# Patient Record
Sex: Female | Born: 1975 | Hispanic: No | Smoking: Never smoker
Health system: Southern US, Community
[De-identification: ages and names within clinical notes are randomized; demographics above are authoritative.]

## PROBLEM LIST (undated history)

## (undated) DIAGNOSIS — R51 Headache: Secondary | ICD-10-CM

## (undated) DIAGNOSIS — G10 Huntington's disease: Secondary | ICD-10-CM

## (undated) DIAGNOSIS — M069 Rheumatoid arthritis, unspecified: Secondary | ICD-10-CM

## (undated) DIAGNOSIS — R519 Headache, unspecified: Secondary | ICD-10-CM

## (undated) DIAGNOSIS — H269 Unspecified cataract: Secondary | ICD-10-CM

## (undated) HISTORY — DX: Headache: R51

## (undated) HISTORY — DX: Rheumatoid arthritis, unspecified: M06.9

## (undated) HISTORY — DX: Headache, unspecified: R51.9

## (undated) HISTORY — PX: KNEE SURGERY: SHX244

## (undated) HISTORY — DX: Unspecified cataract: H26.9

---

## 2008-01-16 ENCOUNTER — Other Ambulatory Visit: Admission: RE | Admit: 2008-01-16 | Discharge: 2008-01-16 | Payer: Self-pay | Admitting: Family Medicine

## 2011-08-19 ENCOUNTER — Other Ambulatory Visit: Payer: Self-pay | Admitting: Nurse Practitioner

## 2011-08-19 DIAGNOSIS — R1900 Intra-abdominal and pelvic swelling, mass and lump, unspecified site: Secondary | ICD-10-CM

## 2011-08-19 DIAGNOSIS — R102 Pelvic and perineal pain: Secondary | ICD-10-CM

## 2011-08-23 ENCOUNTER — Other Ambulatory Visit: Payer: Self-pay

## 2011-08-29 ENCOUNTER — Ambulatory Visit
Admission: RE | Admit: 2011-08-29 | Discharge: 2011-08-29 | Disposition: A | Discharge status: Home / Self Care | Payer: Medicaid Other | Source: Ambulatory Visit | Attending: Nurse Practitioner | Admitting: Nurse Practitioner

## 2011-08-29 DIAGNOSIS — R102 Pelvic and perineal pain: Secondary | ICD-10-CM

## 2011-08-29 DIAGNOSIS — R1900 Intra-abdominal and pelvic swelling, mass and lump, unspecified site: Secondary | ICD-10-CM

## 2013-06-01 ENCOUNTER — Encounter (HOSPITAL_COMMUNITY): Payer: Self-pay | Admitting: Emergency Medicine

## 2013-06-01 ENCOUNTER — Emergency Department (HOSPITAL_COMMUNITY)
Admission: EM | Admit: 2013-06-01 | Discharge: 2013-06-01 | Disposition: A | Discharge status: Home / Self Care | Payer: Medicaid Other | Attending: Emergency Medicine | Admitting: Emergency Medicine

## 2013-06-01 DIAGNOSIS — M129 Arthropathy, unspecified: Secondary | ICD-10-CM | POA: Insufficient documentation

## 2013-06-01 DIAGNOSIS — L02219 Cutaneous abscess of trunk, unspecified: Secondary | ICD-10-CM | POA: Insufficient documentation

## 2013-06-01 DIAGNOSIS — L03319 Cellulitis of trunk, unspecified: Principal | ICD-10-CM

## 2013-06-01 DIAGNOSIS — R000 Tachycardia, unspecified: Secondary | ICD-10-CM | POA: Insufficient documentation

## 2013-06-01 DIAGNOSIS — G10 Huntington's disease: Secondary | ICD-10-CM | POA: Insufficient documentation

## 2013-06-01 DIAGNOSIS — L0291 Cutaneous abscess, unspecified: Secondary | ICD-10-CM

## 2013-06-01 HISTORY — DX: Huntington's disease: G10

## 2013-06-01 LAB — BASIC METABOLIC PANEL
BUN: 12 mg/dL (ref 6–23)
CHLORIDE: 103 meq/L (ref 96–112)
CO2: 22 mEq/L (ref 19–32)
Calcium: 9 mg/dL (ref 8.4–10.5)
Creatinine, Ser: 0.78 mg/dL (ref 0.50–1.10)
GFR calc Af Amer: >90 mL/min (ref >90)
GLUCOSE: 120 mg/dL — AB (ref 70–99)
POTASSIUM: 3.7 meq/L (ref 3.7–5.3)
Sodium: 138 mEq/L (ref 137–147)

## 2013-06-01 LAB — URINALYSIS, ROUTINE W REFLEX MICROSCOPIC
Bilirubin Urine: NEGATIVE
Glucose, UA: 250 mg/dL — AB
Hgb urine dipstick: NEGATIVE
Ketones, ur: NEGATIVE mg/dL
NITRITE: NEGATIVE
PH: 5.5 (ref 5.0–8.0)
Protein, ur: NEGATIVE mg/dL
SPECIFIC GRAVITY, URINE: 1.013 (ref 1.005–1.030)
Urobilinogen, UA: 0.2 mg/dL (ref 0.0–1.0)

## 2013-06-01 LAB — CBC WITH DIFFERENTIAL/PLATELET
Basophils Absolute: 0 10*3/uL (ref 0.0–0.1)
Basophils Relative: 0 % (ref 0–1)
EOS ABS: 0.1 10*3/uL (ref 0.0–0.7)
Eosinophils Relative: 1 % (ref 0–5)
HCT: 40.2 % (ref 36.0–46.0)
HEMOGLOBIN: 13.5 g/dL (ref 12.0–15.0)
LYMPHS ABS: 1.4 10*3/uL (ref 0.7–4.0)
Lymphocytes Relative: 14 % (ref 12–46)
MCH: 29.4 pg (ref 26.0–34.0)
MCHC: 33.6 g/dL (ref 30.0–36.0)
MCV: 87.6 fL (ref 78.0–100.0)
Monocytes Absolute: 0.6 10*3/uL (ref 0.1–1.0)
Monocytes Relative: 7 % (ref 3–12)
NEUTROS ABS: 7.6 10*3/uL (ref 1.7–7.7)
NEUTROS PCT: 78 % — AB (ref 43–77)
PLATELETS: 221 10*3/uL (ref 150–400)
RBC: 4.59 MIL/uL (ref 3.87–5.11)
RDW: 12.4 % (ref 11.5–15.5)
WBC: 9.7 10*3/uL (ref 4.0–10.5)

## 2013-06-01 LAB — POC URINE PREG, ED: PREG TEST UR: NEGATIVE

## 2013-06-01 LAB — URINE MICROSCOPIC-ADD ON

## 2013-06-01 MED ORDER — SODIUM CHLORIDE 0.9 % IV BOLUS (SEPSIS)
1000.0000 mL | Freq: Once | INTRAVENOUS | Status: AC
  Administered 2013-06-01: 1000 mL via INTRAVENOUS

## 2013-06-01 MED ORDER — ACETAMINOPHEN 500 MG PO TABS
1000.0000 mg | ORAL_TABLET | Freq: Once | ORAL | Status: AC
  Administered 2013-06-01: 1000 mg via ORAL
  Filled 2013-06-01: qty 2

## 2013-06-01 MED ORDER — SODIUM CHLORIDE 0.9 % IV SOLN
INTRAVENOUS | Status: DC
  Administered 2013-06-01: 18:00:00 via INTRAVENOUS

## 2013-06-01 NOTE — Discharge Instructions (Signed)
Return to ED in 2 days for reevaluation of Abscess.    Abscess An abscess (boil or furuncle) is an infected area on or under the skin. This area is filled with yellowish-white fluid (pus) and other material (debris). HOME CARE   Only take medicines as told by your doctor.  If you were given antibiotic medicine, take it as directed. Finish the medicine even if you start to feel better.  If gauze is used, follow your doctor's directions for changing the gauze.  To avoid spreading the infection:  Keep your abscess covered with a bandage.  Wash your hands well.  Do not share personal care items, towels, or whirlpools with others.  Avoid skin contact with others.  Keep your skin and clothes clean around the abscess.  Keep all doctor visits as told. GET HELP RIGHT AWAY IF:   You have more pain, puffiness (swelling), or redness in the wound site.  You have more fluid or blood coming from the wound site.  You have muscle aches, chills, or you feel sick.  You have a fever. MAKE SURE YOU:   Understand these instructions.  Will watch your condition.  Will get help right away if you are not doing well or get worse. Document Released: 08/31/2007 Document Revised: 09/13/2011 Document Reviewed: 05/27/2011 [facility] Patient Information 2014 [place]ExitCare, [place]LLC.

## 2013-06-01 NOTE — ED Provider Notes (Signed)
CSN: [id number]     Arrival date & time 06/01/13  1433 History   First MD Initiated Contact with Patient 06/01/13 1446     Chief Complaint  Patient presents with  . Abscess     (Consider location/radiation/quality/duration/timing/severity/associated sxs/prior Treatment) HPI 38 yo female presents with abscess to low back that started 3 days ago. Patient states pain has gotten progressively worse. Pain described as throbbing pain that is rated at 9/10 and constant. Patient has not tried anything for her pain. Patient denies any fever or chills, N/V, HA, CP, SOB, Abdominal pain. PMH significant for Huntingtons disease and arthritis. Denies any current medication use. Denies alcohol or tobacco use.  d Past Medical History  Diagnosis Date  . Huntington disease   . Arthritis    History reviewed. No pertinent past surgical history. No family history on file. History  Substance Use Topics  . Smoking status: Never Smoker   . Smokeless tobacco: Not on file  . Alcohol Use: No   OB History   Grav Para Term Preterm Abortions TAB SAB Ect Mult Living                 Review of Systems  All other systems reviewed and are negative.      Allergies  Bee venom  Home Medications  No current outpatient prescriptions on file. BP 111/71  Pulse 135  Temp(Src) 97.9 F (36.6 C) (Oral)  Resp 18  SpO2 100% Physical Exam  Nursing note and vitals reviewed. Constitutional: She is oriented to person, place, and time. She appears well-developed and well-nourished. No distress.  HENT:  Head: Normocephalic and atraumatic.  Right Ear: Tympanic membrane and ear canal normal.  Left Ear: Tympanic membrane and ear canal normal.  Nose: Nose normal. Right sinus exhibits no maxillary sinus tenderness and no frontal sinus tenderness. Left sinus exhibits no maxillary sinus tenderness and no frontal sinus tenderness.  Mouth/Throat: Uvula is midline, oropharynx is clear and moist and mucous membranes are  normal. No oropharyngeal exudate, posterior oropharyngeal edema or posterior oropharyngeal erythema.  Eyes: Conjunctivae are normal. Right eye exhibits no discharge. Left eye exhibits no discharge. No scleral icterus.  Neck: Phonation normal. Neck supple. No JVD present. No spinous process tenderness and no muscular tenderness present. No rigidity. No tracheal deviation, no edema and no erythema present.  Cardiovascular: Regular rhythm.  Tachycardia present.  Exam reveals no gallop and no friction rub.   No murmur heard. Pulmonary/Chest: Effort normal and breath sounds normal. No stridor. No respiratory distress. She has no wheezes. She has no rhonchi. She has no rales.  Abdominal: Soft. Bowel sounds are normal. She exhibits no distension. There is no hepatosplenomegaly. There is no tenderness. There is no rigidity, no rebound, no guarding, no tenderness at McBurney's point and negative Murphy's sign.  Musculoskeletal: Normal range of motion. She exhibits no edema.  Lymphadenopathy:    She has no cervical adenopathy.  Neurological: She is alert and oriented to person, place, and time.  Skin: Skin is warm and dry. She is not diaphoretic.     Psychiatric: She has a normal mood and affect. Her behavior is normal.    ED Course  Procedures (including critical care time) Labs Review Labs Reviewed  URINALYSIS, ROUTINE W REFLEX MICROSCOPIC - Abnormal; Notable for the following:    APPearance CLOUDY (*)    Glucose, UA 250 (*)    Leukocytes, UA MODERATE (*)    All other components within normal limits  CBC WITH  DIFFERENTIAL - Abnormal; Notable for the following:    Neutrophils Relative % 78 (*)    All other components within normal limits  BASIC METABOLIC PANEL - Abnormal; Notable for the following:    Glucose, Bld 120 (*)    All other components within normal limits  URINE MICROSCOPIC-ADD ON - Abnormal; Notable for the following:    Squamous Epithelial / LPF FEW (*)    Bacteria, UA FEW (*)      All other components within normal limits  POC URINE PREG, ED   Imaging Review No results found.   EKG Interpretation None     INCISION AND DRAINAGE Performed by: Rudene Anda Consent: Verbal consent obtained. Risks and benefits: risks, benefits and alternatives were discussed Time out performed prior to procedure Type: abscess Body area: low back Anesthesia: local infiltration Incision was made with a scalpel. Local anesthetic: lidocaine 2% w/ epinephrine Anesthetic total: 4 ml Complexity: complex Blunt dissection to break up loculations Drainage: purulent Drainage amount: 3 ml Packing material: N/A Patient tolerance: Patient tolerated the procedure well with no immediate complications.  MDM   Final diagnoses:  Abscess    Patient tachycardic on presentation that resolved with IV fluids.   Urine preg negative UA shows mild glucosuria with moderate leukocytes and few bacteria. Sample appears contaminated.  CBC is WNL BMP is WNL  Discussed labs, and exam findings with patient. Advised follow in ED in 2 days for recheck of abscess.  Recommend return to ED should patient develop any fever/chills or worsening symptoms.  Patient agrees with plan. Discharged in good condition.  Meds given in ED:  Medications  acetaminophen (TYLENOL) tablet 1,000 mg (1,000 mg Oral Given 06/01/13 1557)  sodium chloride 0.9 % bolus 1,000 mL (0 mLs Intravenous Stopped 06/01/13 1757)    There are no discharge medications for this patient.          Allen Norris [place], PA-C 06/02/13 [phone]

## 2013-06-01 NOTE — ED Notes (Signed)
Pt presents to department for evaluation of lower back abscess. Noticed x2 days ago. 4/10 pain upon arrival. Red, raised area upon arrival to ED. Pt is alert and oriented x4.

## 2013-06-02 NOTE — ED Provider Notes (Signed)
Medical screening examination/treatment/procedure(s) were conducted as a shared visit with non-physician practitioner(s) and myself.  I personally evaluated the patient during the encounter.   EKG Interpretation None     Here with abscess noted to her lower lumbar spine. Was noted to be mildly tachycardic. Given IV fluids here and blood work evaluated without acute findings. Tachycardia has resolved and she is stable for discharge. No evidence of systemic infection at this time.  Toy BakerAnthony T Oluwateniola Leitch, MD 06/02/13 [phone]

## 2014-01-04 ENCOUNTER — Encounter (HOSPITAL_BASED_OUTPATIENT_CLINIC_OR_DEPARTMENT_OTHER): Payer: Self-pay | Admitting: Emergency Medicine

## 2014-01-04 ENCOUNTER — Emergency Department (HOSPITAL_BASED_OUTPATIENT_CLINIC_OR_DEPARTMENT_OTHER)
Admission: EM | Admit: 2014-01-04 | Discharge: 2014-01-04 | Disposition: A | Discharge status: Home / Self Care | Payer: Medicaid Other | Attending: Emergency Medicine | Admitting: Emergency Medicine

## 2014-01-04 DIAGNOSIS — Z8739 Personal history of other diseases of the musculoskeletal system and connective tissue: Secondary | ICD-10-CM | POA: Insufficient documentation

## 2014-01-04 DIAGNOSIS — Z8669 Personal history of other diseases of the nervous system and sense organs: Secondary | ICD-10-CM | POA: Insufficient documentation

## 2014-01-04 DIAGNOSIS — B850 Pediculosis due to Pediculus humanus capitis: Secondary | ICD-10-CM | POA: Insufficient documentation

## 2014-01-04 DIAGNOSIS — B86 Scabies: Secondary | ICD-10-CM | POA: Insufficient documentation

## 2014-01-04 MED ORDER — PERMETHRIN 5 % EX CREA: TOPICAL_CREAM | CUTANEOUS | Status: DC

## 2014-01-04 MED ORDER — DIPHENHYDRAMINE HCL 25 MG PO TABS: 25.0000 mg | ORAL_TABLET | Freq: Four times a day (QID) | ORAL | Status: DC

## 2014-01-04 MED ORDER — DIPHENHYDRAMINE HCL 25 MG PO CAPS
25.0000 mg | ORAL_CAPSULE | Freq: Once | ORAL | Status: AC
  Administered 2014-01-04: 25 mg via ORAL
  Filled 2014-01-04: qty 1

## 2014-01-04 NOTE — ED Provider Notes (Signed)
CSN: [id number]     Arrival date & time 01/04/14  1025 History  This chart was scribed for Robyn OctaveStephen Noor Vidales, MD by Robyn Valenzuela, ED Scribe. This patient was seen in room MH01/MH01 and the patient's care was started 10:58 AM.    Chief Complaint  Patient presents with  . Rash    The history is provided by the patient. No language interpreter was used.    HPI Comments: Robyn Valenzuela is a 38 y.o. female who presents to the Emergency Department complaining of constant, unchanged itching to the entire body that began 1 month ago and scalp itching that began 1 week ago. She reports living with someone who had similar symptoms. She states she has not seen any insects crawling on or near her. She has not taken any medications to relieve her symptoms. She states she has been eating and drinking well. She denies vomiting. She reports a history of Huntington's disease but does not take medications for this.   Past Medical History  Diagnosis Date  . Huntington disease   . Arthritis    History reviewed. No pertinent past surgical history. No family history on file. History  Substance Use Topics  . Smoking status: Never Smoker   . Smokeless tobacco: Not on file  . Alcohol Use: No   OB History   Grav Para Term Preterm Abortions TAB SAB Ect Mult Living                 Review of Systems  A complete 10 system review of systems was obtained and all systems are negative except as noted in the HPI and PMH.    Allergies  Bee venom  Home Medications   Prior to Admission medications   Medication Sig Start Date End Date Taking? Authorizing Provider  diphenhydrAMINE (BENADRYL) 25 MG tablet Take 1 tablet (25 mg total) by mouth every 6 (six) hours. 01/04/14   Robyn OctaveStephen Ura Hausen, MD  permethrin (ELIMITE) 5 % cream Apply to affected area once 01/04/14   Robyn OctaveStephen Leighana Neyman, MD   BP 134/80  Pulse 90  Temp(Src) 97.8 F (36.6 C) (Oral)  Resp 18  SpO2 100%  LMP 12/16/2013 Physical Exam  Nursing note and  vitals reviewed. Constitutional: She is oriented to person, place, and time. She appears well-developed and well-nourished. No distress.  HENT:  Head: Normocephalic and atraumatic.  Mouth/Throat: Oropharynx is clear and moist. No oropharyngeal exudate.  Eyes: Conjunctivae and EOM are normal. Pupils are equal, round, and reactive to light.  Neck: Normal range of motion. Neck supple.  No meningismus.  Cardiovascular: Normal rate, regular rhythm, normal heart sounds and intact distal pulses.   No murmur heard. Pulmonary/Chest: Effort normal and breath sounds normal. No respiratory distress.  Abdominal: Soft. There is no tenderness. There is no rebound and no guarding.  Musculoskeletal: Normal range of motion. She exhibits no edema and no tenderness.  Neurological: She is alert and oriented to person, place, and time. No cranial nerve deficit. She exhibits normal muscle tone. Coordination normal.  No ataxia on finger to nose bilaterally. No pronator drift. 5/5 strength throughout. CN 2-12 intact. Negative Romberg. Equal grip strength. Sensation intact. Gait is normal.   Skin: Skin is warm. Rash noted.  Areas of excoriation and erythema.  Areas of dandruff and possible nits to scalp  Psychiatric: She has a normal mood and affect. Her behavior is normal.    ED Course  Procedures (including critical care time)  DIAGNOSTIC STUDIES: Oxygen Saturation is 100% on  RA, normal by my interpretation.    COORDINATION OF CARE: 11:02 AM Discussed treatment plan with pt at bedside and pt agreed to plan.   Labs Review Labs Reviewed - No data to display  Imaging Review No results found.   EKG Interpretation None      MDM   Final diagnoses:  Pediculosis capitis  Scabies   Total body and scalp itching x 1 month.  No fevers.  No one with similar symptoms.  Treat for head lice and possible scabies. Instructed to wash clothes and bed linens in hot water and have close contacts treated as  well.  I personally performed the services described in this documentation, which was scribed in my presence. The recorded information has been reviewed and is accurate.  Robyn OctaveStephen Dekari Bures, MD 01/04/14 [phone]

## 2014-01-04 NOTE — ED Notes (Signed)
Pt c/o itchy rash all over body x 1 mo and head itching for 1 week

## 2014-01-04 NOTE — Discharge Instructions (Signed)
Head and Pubic Lice Lice are tiny, light brown insects with claws on the ends of their legs. They are small parasites that live on the human body. Lice often make their home in your hair. They hatch from little round eggs (nits), which are attached to the base of hairs. They spread by:  Direct contact with an infested person.  Infested personal items such as combs, brushes, towels, clothing, pillow cases and sheets. The parasite that causes your condition may also live in clothes which have been worn within the week before treatment. Therefore, it is necessary to wash your clothes, bed linens, towels, combs and brushes. Any woolens can be put in an air-tight plastic bag for one week. You need to use fresh clothes, towels and sheets after your treatment is completed. Re-treatment is usually not necessary if instructions are followed. If necessary, treatment may be repeated in 7 days. The entire family may require treatment. Sexual partners should be treated if the nits are present in the pubic area. TREATMENT  Apply enough medicated shampoo or cream to wet hair and skin in and around the infected areas.  Work thoroughly into hair and leave in according to instructions.  Add a small amount of water until a good lather forms.  Rinse thoroughly.  Towel briskly.  When hair is dry, any remaining nits, cream or shampoo may be removed with a fine-tooth comb or tweezers. The nits resemble dandruff; however they are glued to the hair follicle and are difficult to brush out. Frequent fine combing and shampoos are necessary. A towel soaked in white vinegar and left on the hair for 2 hours will also help soften the glue which holds the nits on the hair. Medicated shampoo or cream should not be used on children or pregnant women without a caregiver's prescription or instructions. SEEK MEDICAL CARE IF:   You or your child develops sores that look infected.  The rash does not go away in one week.  The  lice or nits return or persist in spite of treatment. Document Released: 03/14/2005 Document Revised: 06/06/2011 Document Reviewed: 10/11/2006 [facility] Patient Information 2015 [place]ExitCare, [place]LL[place]. This information is not intended to replace advice given to you by your health care provider. Make sure you discuss any questions you have with your health care provider.   Scabies Scabies are small bugs (mites) that burrow under the skin and cause red bumps and severe itching. These bugs can only be seen with a microscope. Scabies are highly contagious. They can spread easily from person to person by direct contact. They are also spread through sharing clothing or linens that have the scabies mites living in them. It is not unusual for an entire family to become infected through shared towels, clothing, or bedding.  HOME CARE INSTRUCTIONS   Your caregiver may prescribe a cream or lotion to kill the mites. If cream is prescribed, massage the cream into the entire body from the neck to the bottom of both feet. Also massage the cream into the scalp and face if your child is less than 39 year old. Avoid the eyes and mouth. Do not wash your hands after application.  Leave the cream on for 8 to 12 hours. Your child should bathe or shower after the 8 to 12 hour application period. Sometimes it is helpful to apply the cream to your child right before bedtime.  One treatment is usually effective and will eliminate approximately 95% of infestations. For severe cases, your caregiver may decide to  repeat the treatment in 1 week. Everyone in your household should be treated with one application of the cream.  New rashes or burrows should not appear within 24 to 48 hours after successful treatment. However, the itching and rash may last for 2 to 4 weeks after successful treatment. Your caregiver may prescribe a medicine to help with the itching or to help the rash go away more quickly.  Scabies can live on clothing or  linens for up to 3 days. All of your child's recently used clothing, towels, stuffed toys, and bed linens should be washed in hot water and then dried in a dryer for at least 20 minutes on high heat. Items that cannot be washed should be enclosed in a plastic bag for at least 3 days.  To help relieve itching, bathe your child in a cool bath or apply cool washcloths to the affected areas.  Your child may return to school after treatment with the prescribed cream. SEEK MEDICAL CARE IF:   The itching persists longer than 4 weeks after treatment.  The rash spreads or becomes infected. Signs of infection include red blisters or yellow-tan crust. Document Released: 03/14/2005 Document Revised: 06/06/2011 Document Reviewed: 07/23/2008 [facility] Patient Information 2015 [place]ExitCare, [place]. This information is not intended to replace advice given to you by your health care provider. Make sure you discuss any questions you have with your health care provider.

## 2016-08-06 ENCOUNTER — Encounter (HOSPITAL_COMMUNITY): Payer: Self-pay | Admitting: Emergency Medicine

## 2016-08-06 ENCOUNTER — Emergency Department (HOSPITAL_COMMUNITY)
Admission: EM | Admit: 2016-08-06 | Discharge: 2016-08-07 | Disposition: A | Discharge status: Home / Self Care | Payer: Medicaid Other | Attending: Emergency Medicine | Admitting: Emergency Medicine

## 2016-08-06 DIAGNOSIS — T7840XA Allergy, unspecified, initial encounter: Secondary | ICD-10-CM

## 2016-08-06 MED ORDER — DIPHENHYDRAMINE HCL 50 MG/ML IJ SOLN
25.0000 mg | Freq: Once | INTRAMUSCULAR | Status: AC
  Administered 2016-08-06: 25 mg via INTRAVENOUS
  Filled 2016-08-06: qty 1

## 2016-08-06 MED ORDER — FAMOTIDINE IN NACL 20-0.9 MG/50ML-% IV SOLN
20.0000 mg | Freq: Once | INTRAVENOUS | Status: AC
  Administered 2016-08-06: 20 mg via INTRAVENOUS
  Filled 2016-08-06: qty 50

## 2016-08-06 MED ORDER — IBUPROFEN 400 MG PO TABS
400.0000 mg | ORAL_TABLET | Freq: Once | ORAL | Status: AC
  Administered 2016-08-06: 400 mg via ORAL
  Filled 2016-08-06: qty 1

## 2016-08-06 MED ORDER — METHYLPREDNISOLONE SODIUM SUCC 125 MG IJ SOLR
125.0000 mg | Freq: Once | INTRAMUSCULAR | Status: AC
  Administered 2016-08-06: 125 mg via INTRAVENOUS
  Filled 2016-08-06: qty 2

## 2016-08-06 NOTE — ED Notes (Signed)
Pt given ice pack to put on thumb and hand elevated on blankets

## 2016-08-06 NOTE — ED Triage Notes (Signed)
Pt presents to the ED with swelling and redness to the left thumb after being stung by a yellow jacket.  Hx of allergic reaction and need for epi pen.  Pt axo at this time, denies airway involvement.  Some reddening of the face noted.

## 2016-08-06 NOTE — ED Notes (Signed)
ED Provider at bedside. 

## 2016-08-06 NOTE — ED Provider Notes (Signed)
[facility]-EMERGENCY DEPT Provider Note   CSN: [id number] Arrival date & time: 08/06/16  2112     History   Chief Complaint Chief Complaint  Patient presents with  . Insect Bite  . Allergic Reaction    HPI Robyn Flemingndrea Cogle is a 41 y.o. female.  The history is provided by the patient.  Allergic Reaction  Presenting symptoms: itching, rash and swelling   Presenting symptoms: no difficulty breathing and no difficulty swallowing   Severity:  Severe Duration:  1 hour Prior allergic episodes:  Insect allergies Context: insect bite/sting   Relieved by:  None tried Worsened by:  Nothing Ineffective treatments:  None tried   Past Medical History:  Diagnosis Date  . Arthritis   . Huntington disease (HCC)     There are no active problems to display for this patient.   History reviewed. No pertinent surgical history.  OB History    No data available       Home Medications    Prior to Admission medications   Medication Sig Start Date End Date Taking? Authorizing Provider  diphenhydrAMINE (BENADRYL) 25 MG tablet Take 1 tablet (25 mg total) by mouth every 6 (six) hours. 01/04/14   Rancour, Jeannett SeniorStephen, MD  permethrin [place](ELIMITE) 5 % cream Apply to affected area once 01/04/14   Glynn Octaveancour, Stephen, MD    Family History History reviewed. No pertinent family history.  Social History Social History  Substance Use Topics  . Smoking status: Never Smoker  . Smokeless tobacco: Never Used  . Alcohol use No     Allergies   Bee venom   Review of Systems Review of Systems  HENT: Negative for trouble swallowing.   Skin: Positive for itching and rash.  All other systems reviewed and are negative.    Physical Exam Updated Vital Signs BP (!) 128/97 (BP Location: Right Arm)   Pulse 75   Temp 98 F (36.7 C) (Oral)   Resp 16   SpO2 100%   Physical Exam  Constitutional: She is oriented to person, place, and time. She appears well-developed and well-nourished.  HENT:  Head:  Normocephalic and atraumatic.  Mouth/Throat: Oropharynx is clear and moist.  No tongue or uvula edema  Eyes: Conjunctivae and EOM are normal. Pupils are equal, round, and reactive to light.  Neck: Normal range of motion. Neck supple.  Cardiovascular: Normal rate, regular rhythm and intact distal pulses.   No murmur heard. Pulmonary/Chest: Effort normal and breath sounds normal. No respiratory distress. She has no wheezes. She has no rales.  Abdominal: Soft. She exhibits no distension. There is no tenderness. There is no rebound and no guarding.  Musculoskeletal: Normal range of motion. She exhibits no edema or tenderness.  Neurological: She is alert and oriented to person, place, and time.  Skin: Skin is warm and dry. Rash noted. Rash is urticarial. No erythema.  Urticaria diffusely over the entire body. Significant swelling in the edema to the left thumb and thenar eminence  Psychiatric: She has a normal mood and affect. Her behavior is normal.  Nursing note and vitals reviewed.    ED Treatments / Results  Labs (all labs ordered are listed, but only abnormal results are displayed) Labs Reviewed - No data to display  EKG  EKG Interpretation None       Radiology No results found.  Procedures Procedures (including critical care time)  Medications Ordered in ED Medications  diphenhydrAMINE (BENADRYL) injection 25 mg (25 mg Intravenous Given 08/06/16 2203)  methylPREDNISolone sodium succinate (  SOLU-MEDROL) 125 mg/2 mL injection 125 mg (125 mg Intravenous Given 08/06/16 2202)  famotidine (PEPCID) IVPB 20 mg premix (0 mg Intravenous Stopped 08/06/16 2238)  ibuprofen (ADVIL,MOTRIN) tablet 400 mg (400 mg Oral Given 08/06/16 2336)     Initial Impression / Assessment and Plan / ED Course  I have reviewed the triage vital signs and the nursing notes.  Pertinent labs & imaging results that were available during my care of the patient were reviewed by me and considered in my medical  decision making (see chart for details).     Patient is a 41 year old female presenting today after being stung by a yellow jacket. Patient is having a significant allergic reaction at this time but no signs of anaphylaxis or breathing difficulty. She has diffuse urticaria but blood pressure and heart rate within normal limits. Patient was given Benadryl, Solu-Medrol and Pepcid. We'll continue to monitor the patient. She has never had an EpiPen before but will need one for the future. 12:21 AM Rash significantly improved and pt feeling better Final Clinical Impressions(s) / ED Diagnoses   Final diagnoses:  Allergic reaction, initial encounter    New Prescriptions New Prescriptions   EPINEPHRINE (EPIPEN 2-PAK) 0.3 MG/0.3 ML IJ SOAJ INJECTION    Inject 0.3 mLs (0.3 mg total) into the muscle once.   PREDNISONE (DELTASONE) 20 MG TABLET    Take 2 tablets (40 mg total) by mouth daily.     Gwyneth Sprout, MD 08/07/16 [phone]

## 2016-08-06 NOTE — ED Notes (Signed)
Decreased redness noted on face, chest and abd. Rash still present but has decreased some since receiving medication.

## 2016-08-07 MED ORDER — PREDNISONE 20 MG PO TABS
40.0000 mg | ORAL_TABLET | Freq: Every day | ORAL | 0 refills | Status: DC
Start: 2016-08-07 — End: 2017-09-05

## 2016-08-07 MED ORDER — EPINEPHRINE 0.3 MG/0.3ML IJ SOAJ: 0.3000 mg | Freq: Once | INTRAMUSCULAR | 1 refills | Status: AC

## 2017-09-05 ENCOUNTER — Encounter: Payer: Self-pay | Admitting: *Deleted

## 2017-09-05 ENCOUNTER — Encounter: Payer: Self-pay | Admitting: Neurology

## 2017-09-05 ENCOUNTER — Ambulatory Visit (INDEPENDENT_AMBULATORY_CARE_PROVIDER_SITE_OTHER): Payer: Medicare Other | Admitting: Neurology

## 2017-09-05 VITALS — BP 133/86 | HR 94 | Ht 62.0 in | Wt 116.0 lb

## 2017-09-05 DIAGNOSIS — R4189 Other symptoms and signs involving cognitive functions and awareness: Secondary | ICD-10-CM

## 2017-09-05 DIAGNOSIS — G10 Huntington's disease: Secondary | ICD-10-CM | POA: Diagnosis not present

## 2017-09-05 MED ORDER — HALOPERIDOL 1 MG PO TABS: 1.0000 mg | ORAL_TABLET | Freq: Two times a day (BID) | ORAL | 11 refills | Status: DC

## 2017-09-05 NOTE — Progress Notes (Addendum)
PATIENT: Robyn Valenzuela DOB: [birth year]  Chief Complaint  Patient presents with  . Huntington's Disease    She is here with her boyfriend's daughter, Robyn Valenzuela.  Reports being diagnosed with Huntington's Disease five years ago by Dr. Boris Sharper at [facility] but has not been seen in three years. She was involved in a research program in 2016.  Marland Kitchen PCP    Knox Royalty, MD     HISTORICAL  Robyn Valenzuela is a 42 years old female, seen in refer by her primary care physician Dr. Knox Royalty for evaluation of Huntington's disease, initial evaluation was on September 05, 2017.  She is accompanied by Robyn Valenzuela at today's clinical visit.  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  In 2013, she developed facial twitching, eye blinking, was enrolled in Dr. Tilman Neat research program at [facility], but I do not have detailed information about the diagnosis, patient had a gradual worsening chorea movement involving her whole bodies, could not hold still, slurred speech during interview, along with her positive family history to support a diagnosis of Huntington's disease,  She denies significant cognitive function, but today's Moca examination only 11 out of 30, she was involved in research study from 2013 to 2016, but has lost   follow-up, she now [place], her boyfriend was in consternation, Robyn Valenzuela has moved in with her about a month ago.  Patient stated that the purpose of her today's visit is to " fight her mom", she wants to be evaluated for competence to make her own decision.  She denies significant memory loss, but has not been able to drive since 0960 her diagnosis, was noted  to have slurred  sometimes unintelligible speech   REVIEW OF SYSTEMS: Full 14 system review of systems performed and notable only for as above  ALLERGIES: Allergies  Allergen Reactions  . Bee Venom Anaphylaxis    HOME MEDICATIONS: Current Outpatient Medications  Medication Sig Dispense Refill  . IBUPROFEN PO Take by mouth as needed.     No current facility-administered medications for this visit.     PAST MEDICAL HISTORY: Past Medical History:  Diagnosis Date  . Cataract    bilateral  . Frequent headaches   . Huntington disease (HCC)   . Rheumatoid arthritis (HCC)     PAST SURGICAL HISTORY: Past Surgical History:  Procedure Laterality Date  . KNEE SURGERY      FAMILY HISTORY: Family History  Problem Relation Age of Onset  . Hypertension Mother   . Hyperlipidemia Mother   . Huntington's disease Father   . Kidney failure Father     SOCIAL HISTORY:  Social History   Socioeconomic History  . Marital status: Unknown    Spouse name: Not on file  . Number of children: 1  . Years of education: some college  . Highest education level: Not on file  Occupational History  . Occupation: Disabled  Social Needs  . Financial resource strain: Not on file  . Food insecurity:    Worry: Not on file    Inability: Not on file  . Transportation needs:    Medical: Not on file    Non-medical: Not on file  Tobacco Use  . Smoking status: Never Smoker  . Smokeless tobacco: Never Used  Substance and Sexual Activity  . Alcohol use: No  . Drug use: No  . Sexual activity: Not on file  Lifestyle  . Physical activity:    Days per week: Not on file    Minutes per session: Not on file  . Stress: Not on file  Relationships  . Social connections:    Talks on phone: Not on file    Gets together: Not on file    Attends religious service: Not on file    Active member of club or organization: Not on file    Attends meetings of clubs or organizations: Not on file     Relationship status: Not on file  . Intimate partner violence:    Fear of current or ex partner: Not on file    Emotionally abused: Not on file    Physically abused: Not on file    Forced sexual activity: Not on file  Other Topics Concern  . Not on file  Social History Narrative   Lives at home with boyfriend's daughter and son-in-law.   Right-handed.   2 cups caffeine per day.     PHYSICAL EXAM   Vitals:   09/05/17 1426  BP: 133/86  Pulse: 94  Weight: 116 lb (52.6 kg)  Height: 5\' 2"  (1.575 m)    Not recorded      Body mass index is 21.22 kg/m.  PHYSICAL EXAMNIATION:  Gen: NAD, conversant, well nourised, obese, well groomed                     Cardiovascular: Regular rate rhythm, no peripheral edema, warm, nontender. Eyes: Conjunctivae clear without exudates or hemorrhage Neck: Supple, no carotid bruits. Pulmonary: Clear to auscultation bilaterally   NEUROLOGICAL EXAM:  MENTAL STATUS: Speech: Slurred speech, sometimes unintelligible, follow commands,  Montreal Cognitive Assessment  09/05/2017  Visuospatial/ Executive (0/5) 1  Naming (0/3) 1  Attention: Read list of digits (0/2) 1  Attention: Read list of letters (0/1) 1  Attention: Serial 7 subtraction starting at 100 (0/3) 0  Language: Repeat phrase (0/2) 1  Language : Fluency (0/1) 0  Abstraction (0/2) 1  Delayed Recall (0/5) 1  Orientation (0/6) 4  Total 11    CRANIAL NERVES: CN II: Visual fields are full to confrontation. Pupils are round equal and briskly reactive to light. CN III, IV, VI: extraocular movement are normal. No ptosis. CN V: Facial sensation is intact to pinprick in all 3 divisions bilaterally. Corneal responses are intact.  CN VII: Face is symmetric with normal eye closure and smile. CN VIII: Hearing is normal to rubbing fingers CN IX, X: Palate elevates symmetrically. Phonation is normal. CN XI: Head turning and shoulder shrug are intact CN XII: Tongue is midline with normal  movements and no atrophy.  MOTOR: She has frequent body jerking chorea movement, able to move all 4 extremity against gravity without difficulty   REFLEXES: Reflexes are 2+ and symmetric at the biceps, triceps, knees, and ankles. Plantar responses are flexor.  SENSORY: Intact to light touch, pinprick, positional sensation and vibratory sensation are intact in fingers and toes.  COORDINATION: Rapid alternating movements and fine finger movements are intact. There is no dysmetria on finger-to-nose and heel-knee-shin.    GAIT/STANCE: Wide-based, unsteady  DIAGNOSTIC DATA (LABS, IMAGING, TESTING) - I reviewed patient records, labs, notes, testing and imaging myself where available.   ASSESSMENT AND PLAN  Dyneisha  Effie ShyColeman is a 42 y.o. female   Huntington's disease  I do not have detailed genetic testing record  Diagnosis is based on patient's strong family history, her chorea movement, and the previous involvement with Dr. Tilman NeatWalker's Huntington's research from 2013-2016, Cognitive impairment  Moca score is only 11 out of 30,  Along with her significant motor dysfunction, language difficulty, I do think she does need supervised for her daily affairs,  Laboratory evaluation to rule out treatable etiology  zyprexa 5mg  a day for symptoms control   Levert FeinsteinYijun Venissa Nappi, M.D. Ph.D.  [facility] [address], [address] [place], [place] [address] Ph: [phone] Fax: [phone]  CC: Knox RoyaltyJones, Enrico, MD

## 2017-09-06 ENCOUNTER — Telehealth: Payer: Self-pay | Admitting: *Deleted

## 2017-09-06 LAB — CBC WITH DIFFERENTIAL/PLATELET
BASOS ABS: 0 10*3/uL (ref 0.0–0.2)
Basos: 1 %
EOS (ABSOLUTE): 0 10*3/uL (ref 0.0–0.4)
Eos: 1 %
Hematocrit: 42.3 % (ref 34.0–46.6)
Hemoglobin: 13.6 g/dL (ref 11.1–15.9)
Immature Grans (Abs): 0 10*3/uL (ref 0.0–0.1)
Immature Granulocytes: 0 %
LYMPHS ABS: 0.9 10*3/uL (ref 0.7–3.1)
Lymphs: 16 %
MCH: 29.4 pg (ref 26.6–33.0)
MCHC: 32.2 g/dL (ref 31.5–35.7)
MCV: 91 fL (ref 79–97)
Monocytes Absolute: 0.3 10*3/uL (ref 0.1–0.9)
Monocytes: 5 %
Neutrophils Absolute: 4.7 10*3/uL (ref 1.4–7.0)
Neutrophils: 77 %
Platelets: 267 10*3/uL (ref 150–450)
RBC: 4.63 x10E6/uL (ref 3.77–5.28)
RDW: 13 % (ref 12.3–15.4)
WBC: 5.9 10*3/uL (ref 3.4–10.8)

## 2017-09-06 LAB — COMPREHENSIVE METABOLIC PANEL
ALBUMIN: 4.9 g/dL (ref 3.5–5.5)
ALK PHOS: 61 IU/L (ref 39–117)
ALT: 11 IU/L (ref 0–32)
AST: 13 IU/L (ref 0–40)
Albumin/Globulin Ratio: 2 (ref 1.2–2.2)
BILIRUBIN TOTAL: 0.3 mg/dL (ref 0.0–1.2)
BUN/Creatinine Ratio: 12 (ref 9–23)
BUN: 10 mg/dL (ref 6–24)
CHLORIDE: 102 mmol/L (ref 96–106)
CO2: 24 mmol/L (ref 20–29)
Calcium: 9.6 mg/dL (ref 8.7–10.2)
Creatinine, Ser: 0.83 mg/dL (ref 0.57–1.00)
GFR calc Af Amer: 101 mL/min/{1.73_m2} (ref >59)
GFR calc non Af Amer: 87 mL/min/{1.73_m2} (ref >59)
GLOBULIN, TOTAL: 2.5 g/dL (ref 1.5–4.5)
Glucose: 98 mg/dL (ref 65–99)
Potassium: 4.6 mmol/L (ref 3.5–5.2)
Sodium: 141 mmol/L (ref 134–144)
Total Protein: 7.4 g/dL (ref 6.0–8.5)

## 2017-09-06 LAB — VITAMIN B12: VITAMIN B 12: 507 pg/mL (ref 232–1245)

## 2017-09-06 LAB — HIV ANTIBODY (ROUTINE TESTING W REFLEX): HIV Screen 4th Generation wRfx: NONREACTIVE

## 2017-09-06 LAB — RPR: RPR Ser Ql: NONREACTIVE

## 2017-09-06 LAB — TSH: TSH: 2.2 u[IU]/mL (ref 0.450–4.500)

## 2017-09-06 MED ORDER — OLANZAPINE 5 MG PO TABS: 5.0000 mg | ORAL_TABLET | Freq: Every day | ORAL | 11 refills | Status: DC

## 2017-09-06 NOTE — Telephone Encounter (Addendum)
Rx for Haldol 1mg , one tablet BID provided by Dr. Terrace ArabiaYan at patient's appt on 09/05/17.  Received notification from [facility] that this drug is on long term backorder.  The patient will need an alternate medication.

## 2017-09-06 NOTE — Telephone Encounter (Addendum)
Dr. Terrace ArabiaYan has reviewed patient's chart.  She has sent in olanzapine 5mg , one tablet daily to the pharmacy.  The pharmacy has been notified of this change and the Haldol prescription has been voided.   Also, left patient a message (ok per DPR) notifying her of this medication change.  Provided our number to call back with any questions.

## 2017-09-06 NOTE — Addendum Note (Signed)
Addended by: Levert FeinsteinYAN, Laquincy Eastridge on: 09/06/2017 09:33 AM   Modules accepted: Orders

## 2017-09-07 ENCOUNTER — Telehealth: Payer: Self-pay | Admitting: *Deleted

## 2017-09-07 NOTE — Telephone Encounter (Signed)
-----   Message from Levert FeinsteinYijun Yan, MD sent at 09/07/2017  8:58 AM EDT ----- Please call patient for normal laboratory result

## 2017-09-07 NOTE — Telephone Encounter (Signed)
Robyn Valenzuela(on DPR) has called and was made aware of the lab results.   Robyn Valenzuela has requested a call back to clarify a medication that was called in for pt and the pharmacy stating there supply is limited.  Please call

## 2017-09-07 NOTE — Telephone Encounter (Signed)
Returned call - left message (ok per DPR).  Haldol is on long term backorder.  Dr. Terrace ArabiaYan change patient's prescription to olanzapine (Zyprexa).   I also left the message below, previously:  Dr. Terrace ArabiaYan has reviewed patient's chart.  She has sent in olanzapine 5mg , one tablet daily to the pharmacy.  The pharmacy has been notified of this change and the Haldol prescription has been voided.   Also, left patient a message (ok per DPR) notifying her of this medication change.  Provided our number to call back with any questions.

## 2017-09-07 NOTE — Telephone Encounter (Signed)
Left patient a detailed message, with results, on voicemail (ok per DPR).  Provided our number to call back with any questions.  

## 2017-09-12 NOTE — Telephone Encounter (Signed)
She called on call physician to clarify her medication, she did pick up zyprexa 2218m every night

## 2017-09-12 NOTE — Telephone Encounter (Signed)
Pt's sister-in-law called checking on status of RX, she said they were given #30 of haldol. She was not aware of rx for olanzapine. In looking at the snapshot the rx was not rec'd by the pharmacy, the transmission failed. Please resend.

## 2017-09-12 NOTE — Telephone Encounter (Signed)
Returned call again to notify them of the indefinite backorder of Haldol and that the prescription has been changed to olanzapine.  I have called the pharmacy and they have the olanzapine 5mg  rx ready for the patient.  I left this information in a detailed message (ok per DPR).

## 2017-10-02 NOTE — Telephone Encounter (Signed)
Patient states OLANZapine (ZYPREXA) 5 MG tablet is too strong for her to take. She said her Dad died from taking this medication, [facility] [address]

## 2017-10-03 NOTE — Telephone Encounter (Signed)
Left second message requesting patient to return my call.

## 2017-10-03 NOTE — Telephone Encounter (Signed)
Please call patient, the other options would be tetrabenazine, Side effect are follow, main concern is worsening depression, if she does not significant depression, may consider low-dose tetrabenazine, gradually titrating up,  Common  . Gastrointestinal: Nausea (13% )  . Neurologic: Insomnia (22% ), Sedated, Somnolence  . Psychiatric: Anxiety (15% )  . Other: Fatigue (22% ) Serious  . Cardiovascular: Orthostatic hypotension, Prolonged QT interval, Syncope  . Endocrine metabolic: Hyperprolactinemia  . Gastrointestinal: Dysphagia (4% to 10% )  . Neurologic: Akathisia (0% to 20% ), Parkinsonism (3% to 15% )  . Psychiatric: Depression (19% to 35% ), Suicidal thoughts (3% )  . Other: Neuroleptic malignant syndrome

## 2017-10-03 NOTE — Telephone Encounter (Signed)
Left message requesting a return call to discuss medications.

## 2017-10-03 NOTE — Telephone Encounter (Signed)
Pt stepdaughter on DPRFran Lowes- lucas, Robyn Valenzuela [phone] has called for pt. Returning the call to RN Marcelino DusterMichelle, she is asking for a call back.  Fran LowesLucas, Robyn Valenzuela states if RN is not able to reach her on her phone you can speak with her on pt's phone

## 2017-10-04 MED ORDER — TETRABENAZINE 12.5 MG PO TABS: ORAL_TABLET | ORAL | 3 refills | Status: AC

## 2017-10-04 NOTE — Addendum Note (Signed)
Addended by: Lindell SparKIRKMAN, Aylla Huffine C on: 10/04/2017 03:57 PM   Modules accepted: Orders

## 2017-10-04 NOTE — Telephone Encounter (Signed)
Spoke to patient - states she is not currently depressed and has never suffered depression.  She does not wish to continue olanzapine but is agreeable to try tetrabenazine.  The side effects of the medication has been discussed with the patient.    Per vo by Dr. Terrace ArabiaYan, send in a prescription for tetrabenazine 12.5mg , one tablet daily x one week, then one tablet twice daily x one week then one tablet TID thereafter.

## 2017-10-04 NOTE — Addendum Note (Signed)
Addended by: Lindell SparKIRKMAN, Jordie Skalsky C on: 10/04/2017 02:59 PM   Modules accepted: Orders

## 2017-10-04 NOTE — Telephone Encounter (Signed)
Left messages at both numbers provided.

## 2017-10-04 NOTE — Telephone Encounter (Signed)
Spoke to U.S. BancorpKelsey Lucas (on [place]). She is unsure if the patient is willing to switch medications or if she wants to continue with her current prescription.  She is going to discuss with the patient and call us back, if she would like to change therapy.  If she wants to change medications, I need to speak directly to the patient to review the side effects of tetrabenazine.

## 2017-10-17 ENCOUNTER — Telehealth: Payer: Self-pay | Admitting: *Deleted

## 2017-10-17 NOTE — Telephone Encounter (Signed)
PA for tetrabenazine [facility] [phone]) through 7/22/201.  Pt GN#[id number].

## 2018-03-08 ENCOUNTER — Telehealth: Payer: Self-pay | Admitting: *Deleted

## 2018-03-08 ENCOUNTER — Ambulatory Visit: Payer: Medicare Other | Admitting: Neurology

## 2018-03-08 NOTE — Telephone Encounter (Signed)
No showed follow up appointment. 

## 2018-03-09 ENCOUNTER — Encounter: Payer: Self-pay | Admitting: Neurology

## 2019-07-18 ENCOUNTER — Other Ambulatory Visit: Payer: Self-pay | Admitting: Family Medicine

## 2019-07-18 DIAGNOSIS — Z1231 Encounter for screening mammogram for malignant neoplasm of breast: Secondary | ICD-10-CM

## 2019-08-09 ENCOUNTER — Ambulatory Visit: Payer: Self-pay

## 2020-07-10 ENCOUNTER — Other Ambulatory Visit: Payer: Self-pay | Admitting: Endocrinology

## 2020-07-10 DIAGNOSIS — Z1231 Encounter for screening mammogram for malignant neoplasm of breast: Secondary | ICD-10-CM

## 2021-06-30 ENCOUNTER — Other Ambulatory Visit: Payer: Self-pay | Admitting: Endocrinology

## 2021-06-30 DIAGNOSIS — Z1231 Encounter for screening mammogram for malignant neoplasm of breast: Secondary | ICD-10-CM

## 2021-07-15 ENCOUNTER — Ambulatory Visit: Payer: Medicare Other

## 2021-08-03 ENCOUNTER — Ambulatory Visit
Admission: RE | Admit: 2021-08-03 | Discharge: 2021-08-03 | Disposition: A | Discharge status: Home / Self Care | Payer: Medicare HMO | Source: Ambulatory Visit | Attending: Endocrinology | Admitting: Endocrinology

## 2021-08-03 DIAGNOSIS — Z1231 Encounter for screening mammogram for malignant neoplasm of breast: Secondary | ICD-10-CM

## 2022-09-12 ENCOUNTER — Emergency Department (HOSPITAL_COMMUNITY): Payer: Medicare HMO

## 2022-09-12 ENCOUNTER — Other Ambulatory Visit: Payer: Self-pay

## 2022-09-12 ENCOUNTER — Emergency Department (HOSPITAL_COMMUNITY)
Admission: EM | Admit: 2022-09-12 | Discharge: 2022-09-16 | Disposition: A | Discharge status: Skilled Nursing Facility | Payer: Medicare HMO | Attending: Emergency Medicine | Admitting: Emergency Medicine

## 2022-09-12 ENCOUNTER — Encounter (HOSPITAL_COMMUNITY): Payer: Self-pay

## 2022-09-12 DIAGNOSIS — W182XXA Fall in (into) shower or empty bathtub, initial encounter: Secondary | ICD-10-CM | POA: Diagnosis not present

## 2022-09-12 DIAGNOSIS — M549 Dorsalgia, unspecified: Secondary | ICD-10-CM | POA: Diagnosis not present

## 2022-09-12 DIAGNOSIS — R944 Abnormal results of kidney function studies: Secondary | ICD-10-CM | POA: Diagnosis not present

## 2022-09-12 DIAGNOSIS — S0990XA Unspecified injury of head, initial encounter: Secondary | ICD-10-CM | POA: Insufficient documentation

## 2022-09-12 DIAGNOSIS — R2689 Other abnormalities of gait and mobility: Secondary | ICD-10-CM | POA: Insufficient documentation

## 2022-09-12 DIAGNOSIS — Y92002 Bathroom of unspecified non-institutional (private) residence single-family (private) house as the place of occurrence of the external cause: Secondary | ICD-10-CM | POA: Insufficient documentation

## 2022-09-12 DIAGNOSIS — M6281 Muscle weakness (generalized): Secondary | ICD-10-CM | POA: Insufficient documentation

## 2022-09-12 DIAGNOSIS — W19XXXA Unspecified fall, initial encounter: Secondary | ICD-10-CM

## 2022-09-12 DIAGNOSIS — G10 Huntington's disease: Secondary | ICD-10-CM

## 2022-09-12 LAB — COMPREHENSIVE METABOLIC PANEL
ALT: 17 U/L (ref 0–44)
AST: 19 U/L (ref 15–41)
Albumin: 4.2 g/dL (ref 3.5–5.0)
Alkaline Phosphatase: 58 U/L (ref 38–126)
Anion gap: 9 (ref 5–15)
BUN: 27 mg/dL — ABNORMAL HIGH (ref 6–20)
CO2: 25 mmol/L (ref 22–32)
Calcium: 8.9 mg/dL (ref 8.9–10.3)
Chloride: 107 mmol/L (ref 98–111)
Creatinine, Ser: 0.75 mg/dL (ref 0.44–1.00)
GFR, Estimated: >60 mL/min (ref >60)
Glucose, Bld: 106 mg/dL — ABNORMAL HIGH (ref 70–99)
Potassium: 3.2 mmol/L — ABNORMAL LOW (ref 3.5–5.1)
Sodium: 141 mmol/L (ref 135–145)
Total Bilirubin: 0.6 mg/dL (ref 0.3–1.2)
Total Protein: 7 g/dL (ref 6.5–8.1)

## 2022-09-12 LAB — CBC WITH DIFFERENTIAL/PLATELET
Abs Immature Granulocytes: 0.02 10*3/uL (ref 0.00–0.07)
Basophils Absolute: 0 10*3/uL (ref 0.0–0.1)
Basophils Relative: 0 %
Eosinophils Absolute: 0 10*3/uL (ref 0.0–0.5)
Eosinophils Relative: 0 %
HCT: 36.4 % (ref 36.0–46.0)
Hemoglobin: 11.7 g/dL — ABNORMAL LOW (ref 12.0–15.0)
Immature Granulocytes: 0 %
Lymphocytes Relative: 10 %
Lymphs Abs: 0.8 10*3/uL (ref 0.7–4.0)
MCH: 29.8 pg (ref 26.0–34.0)
MCHC: 32.1 g/dL (ref 30.0–36.0)
MCV: 92.9 fL (ref 80.0–100.0)
Monocytes Absolute: 0.4 10*3/uL (ref 0.1–1.0)
Monocytes Relative: 5 %
Neutro Abs: 6.7 10*3/uL (ref 1.7–7.7)
Neutrophils Relative %: 85 %
Platelets: 180 10*3/uL (ref 150–400)
RBC: 3.92 MIL/uL (ref 3.87–5.11)
RDW: 12 % (ref 11.5–15.5)
WBC: 7.9 10*3/uL (ref 4.0–10.5)
nRBC: 0 % (ref 0.0–0.2)

## 2022-09-12 NOTE — ED Provider Notes (Signed)
[place] EMERGENCY DEPARTMENT AT [facility] Provider Note   CSN: [id number] Arrival date & time: 09/12/22  2200     History  Chief Complaint  Patient presents with   Princessa Sweda is a 47 y.o. female.  The history is provided by medical records and the EMS personnel.  Fall   47 year old female with history of Huntington's disease, rheumatoid arthritis, cataracts, presenting to the ED after an unwitnessed fall at home.  EMS reports she apparently fell in the bathroom and landed in the bathtub, there was no water present.  Family somehow got her out and laid her supine on the couch when they arrived.  Not much additional history was given as caregiver present in the home was intoxicated and mother was not present.  Home is without air conditioning but otherwise seemed in ok condition per EMS.   Patient was placed in c-collar as a precaution.  She was able to endorse that she was having diffuse back pain.  Her current mental status and involuntary movements are baseline per family.  Mother en route to the hospital, did arrive on scene about 30 mins after EMS.  Home Medications Prior to Admission medications   Medication Sig Start Date End Date Taking? Authorizing Provider  IBUPROFEN PO Take by mouth as needed.    [provider]  tetrabenazine Guinevere Scarlet) 12.5 MG tablet One tab daily x one week, then one tab BID x one week, then one tab TID thereafter. 10/04/17   Levert Feinstein, MD      Allergies    Bee venom    Review of Systems   Review of Systems  Musculoskeletal:  Positive for back pain.  All other systems reviewed and are negative.   Physical Exam Updated Vital Signs BP 113/72   Pulse 84   Resp 16   Ht 5\' 2"  (1.575 m)   Wt 52.6 kg   SpO2 99%   BMI 21.21 kg/m   Physical Exam Nursing note reviewed.  Constitutional:      Appearance: She is well-developed.  HENT:     Head: Normocephalic and atraumatic.     Comments: No visible head  trauma Eyes:     Conjunctiva/sclera: Conjunctivae normal.     Pupils: Pupils are equal, round, and reactive to light.  Neck:     Comments: C-collar in place Cardiovascular:     Rate and Rhythm: Normal rate and regular rhythm.     Heart sounds: Normal heart sounds.  Pulmonary:     Effort: Pulmonary effort is normal. No respiratory distress.     Breath sounds: Normal breath sounds. No rhonchi.  Abdominal:     General: Bowel sounds are normal.     Palpations: Abdomen is soft.  Musculoskeletal:        General: Normal range of motion.     Comments: Pelvis stable, nontender  Skin:    General: Skin is warm and dry.  Neurological:     Mental Status: She is alert and oriented to person, place, and time.     Comments: Awake, alert, seemingly oriented, able to answer simple questions, does have involuntary choreiform movements present, mostly of the head and left upper extremity which is baseline per family     ED Results / Procedures / Treatments   Labs (all labs ordered are listed, but only abnormal results are displayed) Labs Reviewed  COMPREHENSIVE METABOLIC PANEL - Abnormal; Notable for the following components:  Result Value   Potassium 3.2 (*)    Glucose, Bld 106 (*)    BUN 27 (*)    All other components within normal limits  CBC WITH DIFFERENTIAL/PLATELET - Abnormal; Notable for the following components:   Hemoglobin 11.7 (*)    All other components within normal limits  CBC WITH DIFFERENTIAL/PLATELET    EKG None  Radiology DG Lumbar Spine Complete  Result Date: 09/12/2022 CLINICAL DATA:  Recent fall with low back pain, initial encounter EXAM: LUMBAR SPINE - COMPLETE 4+ VIEW COMPARISON:  None Available. FINDINGS: Five lumbar type vertebral bodies are well visualized. Vertebral body height is well maintained. Mild anterolisthesis of L4 on L5 is noted of a degenerative nature. No soft tissue abnormality is seen. IMPRESSION: Mild degenerative change as described.  Electronically Signed   By: Alcide Clever M.D.   On: 09/12/2022 22:46   DG Thoracic Spine 2 View  Result Date: 09/12/2022 CLINICAL DATA:  Recent fall with chest pain, initial encounter EXAM: THORACIC SPINE 2 VIEWS COMPARISON:  None Available. FINDINGS: There is no evidence of thoracic spine fracture. Alignment is normal. No other significant bone abnormalities are identified. IMPRESSION: No acute abnormality noted. Electronically Signed   By: Alcide Clever M.D.   On: 09/12/2022 22:45   CT HEAD WO CONTRAST ( )  Result Date: 09/12/2022 CLINICAL DATA:  Moderate to severe head trauma. History of Huntington's disease. Found down in bathtub by caregivers. EXAM: CT HEAD WITHOUT CONTRAST CT CERVICAL SPINE WITHOUT CONTRAST TECHNIQUE: Multidetector CT imaging of the head and cervical spine was performed following the standard protocol without intravenous contrast. Multiplanar CT image reconstructions of the cervical spine were also generated. RADIATION DOSE REDUCTION: This exam was performed according to the departmental dose-optimization program which includes automated exposure control, adjustment of the mA and/or kV according to patient size and/or use of iterative reconstruction technique. COMPARISON:  None Available. FINDINGS: CT HEAD FINDINGS Brain: No intracranial hemorrhage, mass effect, or evidence of acute infarct. No hydrocephalus. No extra-axial fluid collection. Marked generalized cerebral atrophy. Vascular: No hyperdense vessel. Intracranial arterial calcification. Skull: No fracture or focal lesion. Sinuses/Orbits: No acute finding. Paranasal sinuses and mastoid air cells are well aerated. Other: None. CT CERVICAL SPINE FINDINGS Alignment: No evidence of traumatic malalignment. Skull base and vertebrae: No acute fracture. No primary bone lesion or focal pathologic process. Soft tissues and spinal canal: No prevertebral fluid or swelling. No visible canal hematoma. Disc levels: Disc space height is  maintained. No severe spinal canal or neural foraminal narrowing. Upper chest: Negative. Other: None. IMPRESSION: 1. No acute intracranial abnormality. Advanced for age cerebral atrophy. 2. No acute fracture in the cervical spine. Electronically Signed   By: Minerva Fester M.D.   On: 09/12/2022 22:34   CT Cervical Spine Wo Contrast  Result Date: 09/12/2022 CLINICAL DATA:  Moderate to severe head trauma. History of Huntington's disease. Found down in bathtub by caregivers. EXAM: CT HEAD WITHOUT CONTRAST CT CERVICAL SPINE WITHOUT CONTRAST TECHNIQUE: Multidetector CT imaging of the head and cervical spine was performed following the standard protocol without intravenous contrast. Multiplanar CT image reconstructions of the cervical spine were also generated. RADIATION DOSE REDUCTION: This exam was performed according to the departmental dose-optimization program which includes automated exposure control, adjustment of the mA and/or kV according to patient size and/or use of iterative reconstruction technique. COMPARISON:  None Available. FINDINGS: CT HEAD FINDINGS Brain: No intracranial hemorrhage, mass effect, or evidence of acute infarct. No hydrocephalus. No extra-axial fluid collection. Marked generalized  cerebral atrophy. Vascular: No hyperdense vessel. Intracranial arterial calcification. Skull: No fracture or focal lesion. Sinuses/Orbits: No acute finding. Paranasal sinuses and mastoid air cells are well aerated. Other: None. CT CERVICAL SPINE FINDINGS Alignment: No evidence of traumatic malalignment. Skull base and vertebrae: No acute fracture. No primary bone lesion or focal pathologic process. Soft tissues and spinal canal: No prevertebral fluid or swelling. No visible canal hematoma. Disc levels: Disc space height is maintained. No severe spinal canal or neural foraminal narrowing. Upper chest: Negative. Other: None. IMPRESSION: 1. No acute intracranial abnormality. Advanced for age cerebral atrophy. 2.  No acute fracture in the cervical spine. Electronically Signed   By: Minerva Fester M.D.   On: 09/12/2022 22:34    Procedures Procedures    Medications Ordered in ED Medications - No data to display  ED Course/ Medical Decision Making/ A&P                             Medical Decision Making Amount and/or Complexity of Data Reviewed Labs: ordered. Radiology: ordered and independent interpretation performed. ECG/medicine tests: ordered and independent interpretation performed.  Risk Prescription drug management.   47 year old female presenting to the ED after unwitnessed fall in the bathroom.  She has history of Huntington's chorea and is unstable on her feet at baseline.  Apparently fell into the bathtub but was moved to the couch by family prior to arrival.  She complains of back pain.  She is seemingly awake and alert, is able to answer simple questions with "yes or no".  She does have choreiform movements at baseline, notably of the head and left upper extremity.  Will obtain CT head and neck, plain films of thoracic and lumbar spine.  Will also check some basic labs as EMS reported she was in a home without air conditioning she does appear little dry on exam.  Labs as above, BUN elevated but normal creatinine.  No leukocytosis.  Imaging is negative for any acute findings.  C-collar was removed.  12:23 AM Mother at bedside, long discussion with her about patient's current state and living conditions.  Mother states she been trying to get patient to come live with her, however she refuses.  The mobile home currently living in is legally owned by patient but is without air conditioning.  Was initially living their with ex boyfriend, however now ex-boyfriend's daughter and her husband have since moved in and continuously drink alcohol and do drugs throughout the day.  They are supposed to be serving as "caregivers" however do not help care for her, do not give medications, etc.  Mother  has reached out to several social workers over the past few months trying to place her into facility for total care, however keeps getting the run around and cannot make any headway on this.  Mother does not feel safe sending her back to her home.  Given patient's current condition and state of her living arrangements, I do not feel it is safe to send her home at this time.  Her reported "caregiver" was intoxicated upon EMS arrival and does not sound reliable.  Will board in the ED overnight and have social work see her in the morning.  Mother has gone home for the night to sleep, will be available by phone and can come back in the AM if needed.  Care will be signed out to day team to follow-up on CSW recommendations.  Final Clinical Impression(s) /  ED Diagnoses Final diagnoses:  Fall, initial encounter    Rx / DC Orders ED Discharge Orders     None         Oletha Blend 09/13/22 0602    Rondel Baton, MD 09/14/22 1052

## 2022-09-12 NOTE — ED Triage Notes (Signed)
Patient BIB EMS. Patient has advanced huntington's disease. Patient found down in bathtub by caregivers. Caregivers were unsure how long she had been down. Per EMS caregivers did not know what medications she was on or allergies. Per EMS patient's home has no air conditioning.  Mother was on scene about 30 mins after EMS arrival. Mother does not live with patient.

## 2022-09-13 MED ORDER — IBUPROFEN 200 MG PO TABS
400.0000 mg | ORAL_TABLET | Freq: Four times a day (QID) | ORAL | Status: DC | PRN
  Filled 2022-09-13 (×2): qty 2

## 2022-09-13 MED ORDER — OLANZAPINE 5 MG PO TBDP
5.0000 mg | ORAL_TABLET | Freq: Every day | ORAL | Status: DC
  Administered 2022-09-13 – 2022-09-15 (×4): 5 mg via ORAL
  Filled 2022-09-13 (×4): qty 1

## 2022-09-13 MED ORDER — TETRABENAZINE 12.5 MG PO TABS: 12.5000 mg | ORAL_TABLET | Freq: Three times a day (TID) | ORAL | Status: DC

## 2022-09-13 NOTE — Progress Notes (Addendum)
Transition of Care [facility]) - Emergency Department Mini Assessment   Patient Details  Name: Robyn Valenzuela MRN: [id number] Date of Birth: 23-Mar-1976  Transition of Care [facility]) CM/SW Contact:    Princella Ion, LCSW Phone Number: 09/13/2022, 8:27 AM   Clinical Narrative: Pt presented to ED after an unwitnessed fall. Pt was found in tub by caregivers. Per chart review, pt lives with her ex-boyfriend's daughter and her husband. Pt's mom who is also her Legal Guardian reportedly does not live in the home but has been trying to get the pt to live her with; however, pt refuses. It appears pt is connected to [facility] for neurology and has regular appts. Pt's mother has been provided Fl2's by [facility]'s social workers but has been unable to make progress with securing placement. This CSW attempted to contact pt's mom, Dewayne Hatch [phone]) without success - also unable to leave a voicemail.   This CSW went to speak to pt at bedside. Pt's verbal communication is limited and pt  mostly replies "yes." The pt was able to respond "yes" when this CSW confirmed household dynamics, mother living in her own home, mother being her Legal Guardian, and wanting to now live with her mother. This CSW informed that she is unable to make contact and will initiate a welfare check to attempt to make contact with her roommates.   This CSW contacted [place] [employer] to request a welfare check. Awaiting a call from an officer with findings.   Addend @ 8:37 AM Corporal reported he is unable to make contact with anyone in the home. States no one is coming to the door.   Addend @ 8:43 AM Attempted to contact Domingo Cocking, pt's social worker at [facility] to inquire about contact numbers. Left HIPAA Compliant voicemail.   ED Mini Assessment: What brought you to the Emergency Department? : Fall - caregivers found pt in the tub  Barriers to Discharge: Unsafe home situation  Barrier interventions: Per chart review, pt lives with  ex-boyfriend's daughter and her husband. Pt's mom/Legal Guardian reported pt is able to come live with her. Pt in agreement.  Means of departure: Not know       Patient Contact and Communications Key Contact 1: Mom - Unable to make contact   Spoke with: Patient Contact Date: 09/13/22,   Contact time: 0815   Call outcome: Pt agreeable to staying with mother  Patient states their goals for this hospitalization and ongoing recovery are:: Live with her mother.      Admission diagnosis:  Fall Patient Active Problem List   Diagnosis Date Noted   Huntington disease (HCC) 09/05/2017   Cognitive impairment 09/05/2017   PCP:  Default, Provider, MD Pharmacy:   [id number] [address] ([address]), [place] - [address] [place] ([place]) [place] [address] Phone: [phone] Fax: [phone]  [facility] - [place], [place] - [address] [place] [address] Phone: [phone] Fax: [phone]

## 2022-09-13 NOTE — Evaluation (Signed)
Physical Therapy Evaluation Patient Details Name: Robyn Valenzuela MRN: [id number] DOB: 1975-09-11 Today's Date: 09/13/2022  History of Present Illness  Pt is a 47 yo female with history of Huntington's disease, rheumatoid arthritis, cataracts, presenting to the ED after an unwitnessed fall at home.  Clinical Impression  Pt admitted with above diagnosis. Unsure of pt's baseline and home setup, per chart review pt from home with caregivers. Pt states "no" and "yes" to some questions and at other times pt's speech unintelligible. Pt able to follow 1 step commands consistently. Pt needing min A for bed mobility to physical manage trunk and steady self. Pt quickly stands up at EOB, min G+2 for safety, somewhat posterior lean with L trunk lean needing multimodal cues to upright trunk. Pt takes unsteady steps forward, back and laterally up to [facility] with bil HHA. Choreiform movements noted during eval, AROM WFL, strength grossly 3+/5, doesn't appear to be in pain. No family present, recommend SNF vs LTC pending baseline and family support. Pt currently with functional limitations due to the deficits listed below (see PT Problem List). Pt will benefit from acute skilled PT to increase their independence and safety with mobility to allow discharge.           Recommendations for follow up therapy are one component of a multi-disciplinary discharge planning process, led by the attending physician.  Recommendations may be updated based on patient status, additional functional criteria and insurance authorization.  Follow Up Recommendations Can patient physically be transported by private vehicle: No     Assistance Recommended at Discharge Frequent or constant Supervision/Assistance  Patient can return home with the following  A little help with walking and/or transfers;A little help with bathing/dressing/bathroom;Assistance with cooking/housework;Assist for transportation;Help with stairs or ramp for  entrance;Direct supervision/assist for medications management    Equipment Recommendations None recommended by PT (defer to next venue)  Recommendations for Other Services       Functional Status Assessment Patient has had a recent decline in their functional status and demonstrates the ability to make significant improvements in function in a reasonable and predictable amount of time.     Precautions / Restrictions Precautions Precautions: Fall Restrictions Weight Bearing Restrictions: No      Mobility  Bed Mobility Overal bed mobility: Needs Assistance Bed Mobility: Supine to Sit, Sit to Supine     Supine to sit: Min assist Sit to supine: Min assist   General bed mobility comments: min A to upright trunk and steady trunk with bringing BLE to EOB and slide out to EOB; min A to return to supine    Transfers Overall transfer level: Needs assistance Equipment used: 2 person hand held assist Transfers: Sit to/from Stand Sit to Stand: +2 safety/equipment, Min guard           General transfer comment: min G+2 for safety, pt quickly powers to stand from elevated gurney without AD present, holds to therapist's hands, generally unsteady without bil knee buckling    Ambulation/Gait Ambulation/Gait assistance: Min assist, +2 physical assistance, +2 safety/equipment   Assistive device: 2 person hand held assist         General Gait Details: pt takes 2 short, shuffling steps forward, backward and sidesteps, choreiform movements noted, trunk lean to L and somewhat posterior lean needing verbal/tactile cues to come to upright standing  Stairs            Wheelchair Mobility    Modified Rankin (Stroke Patients Only)  Balance Overall balance assessment: Needs assistance Sitting-balance support: Feet unsupported, Single extremity supported Sitting balance-Leahy Scale: Fair Sitting balance - Comments: close supv while seated on elevated gurney, intermittent UE  support   Standing balance support: Reliant on assistive device for balance, During functional activity, Bilateral upper extremity supported Standing balance-Leahy Scale: Poor                               Pertinent Vitals/Pain Pain Assessment Pain Assessment: Faces Faces Pain Scale: No hurt    Home Living Family/patient expects to be discharged to:: Skilled nursing facility                   Additional Comments: Per chart review, pt lives at home with 2 caregivers (ex-boyfriend's daughter and her husband), pt's mother seeking LTC placement.    Prior Function Prior Level of Function : Patient poor historian/Family not available             Mobility Comments: Pt reports "no" when asked if using AD, has caregivers but unable to report what they assist with; no one present to assist with PLOF ADLs Comments: Pt reports "no" when asked if caregivers assist with bathing, dressing and toileting; reports "yes" when asked if caregivers cook and clean the home; no one present to assist with PLOF     Hand Dominance        Extremity/Trunk Assessment   Upper Extremity Assessment Upper Extremity Assessment: Generalized weakness    Lower Extremity Assessment Lower Extremity Assessment: Generalized weakness (AROM WFL, strength grossly 3+/5, states "no" to numbness/tingling throughout BLE)    Cervical / Trunk Assessment Cervical / Trunk Assessment: Normal  Communication   Communication: Other (comment) (speech not always intelligble, reports "yes" and "no" to most questions)  Cognition Arousal/Alertness: Awake/alert Behavior During Therapy: Flat affect Overall Cognitive Status: No family/caregiver present to determine baseline cognitive functioning                                 General Comments: Pt states yes and no to questions but at times unintelligble. Pt able to follow 1 step commands consistently.        General Comments       Exercises     Assessment/Plan    PT Assessment Patient needs continued PT services  PT Problem List Decreased strength;Decreased activity tolerance;Decreased balance;Decreased mobility;Decreased coordination;Decreased cognition;Decreased knowledge of use of DME;Decreased safety awareness       PT Treatment Interventions DME instruction;Gait training;Functional mobility training;Therapeutic activities;Therapeutic exercise;Balance training;Neuromuscular re-education;Patient/family education    PT Goals (Current goals can be found in the Care Plan section)  Acute Rehab PT Goals Patient Stated Goal: pt agreeable to therapy PT Goal Formulation: With patient Time For Goal Achievement: 09/27/22 Potential to Achieve Goals: Good    Frequency Min 1X/week     Co-evaluation               AM-PAC PT "6 Clicks" Mobility  Outcome Measure Help needed turning from your back to your side while in a flat bed without using bedrails?: A Little Help needed moving from lying on your back to sitting on the side of a flat bed without using bedrails?: A Little Help needed moving to and from a bed to a chair (including a wheelchair)?: A Little Help needed standing up from a chair using your arms (e.g., wheelchair  or bedside chair)?: A Little Help needed to walk in hospital room?: Total Help needed climbing 3-5 steps with a railing? : Total 6 Click Score: 14    End of Session   Activity Tolerance: Patient tolerated treatment well Patient left: in bed;with call bell/phone within reach Nurse Communication: Mobility status PT Visit Diagnosis: Other abnormalities of gait and mobility (R26.89);Muscle weakness (generalized) (M62.81);Other symptoms and signs involving the nervous system (B14.782)    Time: 9562-1308 PT Time Calculation (min) (ACUTE ONLY): 13 min   Charges:   PT Evaluation $PT Eval Low Complexity: 1 Low          Tori Jermon Chalfant PT, DPT 09/13/22, 1:07 PM

## 2022-09-13 NOTE — Social Work (Signed)
At this time there has been no update from Tammy on the patients INS- AUTH. TOC will continue to follow and update as they come.

## 2022-09-13 NOTE — Progress Notes (Addendum)
This CSW attempted to initiate Auth through [facility] - pt's account is not managed through the portal. This CSW was unable to find pt in [employer] - outreached to Tammy who will request that facility [facility]) Barrister's clerk. TOC following.  Addend @ 2:02 PM Tammy reported [facility] was able to initiate Auth.

## 2022-09-13 NOTE — ED Provider Notes (Signed)
  Physical Exam  BP 109/77   Pulse (!) 55   Temp 98.1 F (36.7 C) (Oral)   Resp 13   Ht 5\' 2"  (1.575 m)   Wt 52.6 kg   LMP  (LMP Unknown)   SpO2 99%   BMI 21.21 kg/m   Physical Exam  Procedures  Procedures  ED Course / MDM    Medical Decision Making Amount and/or Complexity of Data Reviewed Labs: ordered. Radiology: ordered.  Risk Prescription drug management.   Patient care assumed at shift handoff from Sharilyn Sites, PA-C.  Patient with history of Huntington's disease presented to the emergency department after unwitnessed fall.  Patient unstable on feet at baseline and apparently fell into a bathtub.  Patient was able to answer simple questions with yes or no and has choreiform movements at baseline. Imaging was negative for acute findings.  Patient with difficulty with living arrangements.  Patient signed out with plan for social work involvement for possible placement in a care facility.  And have social work working with patient's outpatient social worker on possible placement.  PT eval also pending.  Patient placed in boarding status      Pamala Duffel 09/13/22 1328    Pricilla Loveless, MD 09/15/22 [phone]

## 2022-09-13 NOTE — NC FL2 (Signed)
  [place] MEDICAID FL2 LEVEL OF CARE FORM     IDENTIFICATION  Patient Name: Robyn Valenzuela Birthdate: [birth year] Sex: female Admission Date (Current Location): 09/12/2022  [facility] and [place] Number:  Producer, television/film/video and Address:  [facility]  [address] [place], [place] [address]      Provider Number: [id number]  Attending Physician Name and Address:  Default, Provider, MD  Relative Name and Phone Number:  Jon Billings (Mother) [phone] (    Current Level of Care: Hospital Recommended Level of Care: Skilled Nursing Facility Prior Approval Number:    Date Approved/Denied:   PASRR Number: [id number]  Discharge Plan: SNF    Current Diagnoses: Patient Active Problem List   Diagnosis Date Noted   Huntington disease (HCC) 09/05/2017   Cognitive impairment 09/05/2017    Orientation RESPIRATION BLADDER Height & Weight     Self, Place  Normal Incontinent Weight: 115 lb 15.4 oz (52.6 kg) Height:  5\' 2"  (157.5 cm)  BEHAVIORAL SYMPTOMS/MOOD NEUROLOGICAL BOWEL NUTRITION STATUS      Incontinent Diet (Regular)  AMBULATORY STATUS COMMUNICATION OF NEEDS Skin   Extensive Assist  (Mostly responds "yeah") Normal                       Personal Care Assistance Level of Assistance  Bathing, Feeding, Dressing Bathing Assistance: Limited assistance Feeding assistance: Limited assistance Dressing Assistance: Limited assistance     Functional Limitations Info  Sight, Hearing, Speech Sight Info: Impaired (wears glasses) Hearing Info: Adequate Speech Info: Impaired    SPECIAL CARE FACTORS FREQUENCY  PT (By licensed PT), OT (By licensed OT)     PT Frequency: x5/week OT Frequency: x5/week            Contractures Contractures Info: Not present    Additional Factors Info  Code Status, Allergies, Psychotropic Code Status Info: Not on File Allergies Info: Bee Venom           Current Medications (09/13/2022):  This is the current hospital  active medication list Current Facility-Administered Medications  Medication Dose Route Frequency Provider Last Rate Last Admin   OLANZapine zydis (ZYPREXA) disintegrating tablet 5 mg  5 mg Oral QHS Garlon Hatchet, PA-C   5 mg at 09/13/22 8469   Current Outpatient Medications  Medication Sig Dispense Refill   IBUPROFEN PO Take by mouth as needed.     tetrabenazine (XENAZINE) 12.5 MG tablet One tab daily x one week, then one tab BID x one week, then one tab TID thereafter. 270 tablet 3     Discharge Medications: Please see discharge summary for a list of discharge medications.  Relevant Imaging Results:  Relevant Lab Results:   Additional Information SSN: [id number]  Princella Ion, LCSW

## 2022-09-13 NOTE — Progress Notes (Addendum)
This CSW spoke with pt's mom, Dewayne Hatch, to provide an update on current plan. Ann provided her email in case it is needed: [email or web address].   This CSW contacted Tammy with [facility] to also provide an update. Tammy informed that tentative plan (if Berkley Harvey is received) is now for:  [facility] [address] [place], [place] [address]   TOC following for PT eval and will initiate Auth.   NCMUST ID: [id number]  This CSW contacted [facility] to request that SSN is updated in chart. Chart reviewed and is up to date.

## 2022-09-13 NOTE — ED Notes (Addendum)
RN to room , pt is resting, pt denies needing to be changed or needing to use the restroom. RN will continue care plan

## 2022-09-13 NOTE — ED Notes (Signed)
RN to room , pt denies needing to be changed at this time , pt on monitor, RN will continue to let pt rest, call light in reach.

## 2022-09-13 NOTE — Progress Notes (Addendum)
This CSW contacted pt's mother [phone]). Pt's mother reported that she is in communication with RN of [facility], Tammy [phone]) regarding long term care placement. This CSW explained to pt's mother the SNF process. Pt's mother verbalized understanding.  This CSW contacted Tammy and was informed that she thinks the pt would benefit from [facility] in [place] contingent upon wander guard system. If wander guard system is not in place, Tammy will consider another facility. This CSW will update Tammy once a determination has been made by [facility] Ms. Tammy to outreach to pt's mother regarding completion of admission paperwork. PT eval pending.   Addend @ 11:47 AM This CSW outreached to PT and was informed that it may be later this afternoon before the pt will be seen.

## 2022-09-14 NOTE — ED Notes (Signed)
RN to room, introduced self to pt, pt denies any needs at this time. RR even and unlabored , pt denies any needs at this time. Pt denies needing to be changed or having to use the restroom. Pt bed low and locked, rails x2, call light in reach.

## 2022-09-14 NOTE — ED Notes (Signed)
Pt was repositioned and had meal tray placed within reach.

## 2022-09-14 NOTE — Progress Notes (Addendum)
Insurance Auth still pending at this time. Tammy informed she will notify TOC once there is a determination.  Addend @ 1:16 PM Tammy informed insurance requested additional documentation but there is still no determination as of yet.

## 2022-09-14 NOTE — ED Provider Notes (Signed)
Emergency Medicine Observation Re-evaluation Note  Robyn Valenzuela is a 47 y.o. female, seen on rounds today.  Pt initially presented to the ED for complaints of Fall Currently, the patient is resting comfortably.  Physical Exam  BP 125/81   Pulse 65   Temp 98.1 F (36.7 C) (Oral)   Resp 19   Ht 1.575 m (5\' 2" )   Wt 52.6 kg   LMP  (LMP Unknown)   SpO2 100%   BMI 21.21 kg/m  Physical Exam    ED Course / MDM  EKG:   I have reviewed the labs performed to date as well as medications administered while in observation.  Recent changes in the last 24 hours include none.  Plan  Current plan is for placement.    Lorre Nick, MD 09/14/22 [phone]

## 2022-09-14 NOTE — ED Notes (Signed)
RN to room and provided pt a warm blanket, pt denies any needs at this time, bed low and locked, railsx2 , call light in reach

## 2022-09-14 NOTE — ED Notes (Signed)
Pt with eyes closed, chest rise and fall noted, bed low and locked ,rails x2, call light in reach.

## 2022-09-15 NOTE — Progress Notes (Addendum)
Auth still pending.   Addend @ 1:38 PM SNF outreached to [facility] and was informed Berkley Harvey is still pending. SNF to notify Tammy once a determination is made. Tammy will notify TOC.

## 2022-09-15 NOTE — ED Notes (Signed)
Patient to room 30 .   Patient oriented to unit and room.

## 2022-09-16 ENCOUNTER — Encounter (HOSPITAL_COMMUNITY): Payer: Self-pay

## 2022-09-16 NOTE — Progress Notes (Signed)
Physical Therapy Treatment Patient Details Name: Robyn Valenzuela MRN: [id number] DOB: 08/13/1975 Today's Date: 09/16/2022   History of Present Illness Pt is a 47 yo female with history of Huntington's disease, rheumatoid arthritis, cataracts, presenting to the ED after an unwitnessed fall at home.    PT Comments    Pt more conversational today, unintelligible speech ~25% of the time. Pt poewrs to stand from EOB with supv-min G, no AD. Pt ambulates in hallway without AD, min G to min A, no overt LOB, wide BOS with limited bil knee flexion and choreiform movements in BUE and head/neck. Pt modified ind with bed mobility, needing increased time and effort, but no physical assist or verbal cues.  Recommendations for follow up therapy are one component of a multi-disciplinary discharge planning process, led by the attending physician.  Recommendations may be updated based on patient status, additional functional criteria and insurance authorization.  Follow Up Recommendations  Can patient physically be transported by private vehicle: Yes    Assistance Recommended at Discharge Frequent or constant Supervision/Assistance  Patient can return home with the following A little help with walking and/or transfers;A little help with bathing/dressing/bathroom;Assistance with cooking/housework;Assist for transportation;Help with stairs or ramp for entrance;Direct supervision/assist for medications management   Equipment Recommendations  None recommended by PT    Recommendations for Other Services       Precautions / Restrictions Precautions Precautions: Fall Restrictions Weight Bearing Restrictions: No     Mobility  Bed Mobility Overal bed mobility: Modified Independent             General bed mobility comments: modified ind with supine<>sit, increased time with choreiform movements, no physical assist or cues    Transfers Overall transfer level: Needs assistance Equipment used:  None Transfers: Sit to/from Stand Sit to Stand: Supervision, Min guard           General transfer comment: supv to min G with STS transfers from EOB, good steadiness and normal postural alignment    Ambulation/Gait Ambulation/Gait assistance: Min guard, Min assist Gait Distance (Feet): 250 Feet Assistive device: None Gait Pattern/deviations: Wide base of support, Step-through pattern, Decreased stride length Gait velocity: decreased     General Gait Details: pt with wide BOS with increased lateral weight shifting and minimal bil knee flexion, choreiform movements with BUE and head/neck, decreased heel-toe pattern, no AD needed and no overt LOB but needing intermittent min A with turns and direction changes   Stairs             Wheelchair Mobility    Modified Rankin (Stroke Patients Only)       Balance Overall balance assessment: Needs assistance Sitting-balance support: Feet unsupported, Single extremity supported Sitting balance-Leahy Scale: Good Sitting balance - Comments: close supv, choreiform movements in BUE and trunk, able to right back to midline   Standing balance support: During functional activity Standing balance-Leahy Scale: Fair Standing balance comment: min G-min A with gait and no AD, no overt LOB, choreiform movements in BUE and head/neck                            Cognition Arousal/Alertness: Awake/alert Behavior During Therapy: Flat affect Overall Cognitive Status: No family/caregiver present to determine baseline cognitive functioning                                 General Comments: Pt more conversational  and with unintelligle speech ~25% of the time this session. Pt reports lives at home with 3 steps to enter, independent with showering, dressing and toileting. Pt reports no AD use, has no AD at home, reports 1 fall in 6 months.        Exercises      General Comments        Pertinent Vitals/Pain Pain  Assessment Pain Assessment: No/denies pain    Home Living Family/patient expects to be discharged to:: Skilled nursing facility                   Additional Comments: Pt reports lives in single level home, 3 steps to enter, shower, names 2 people as caregivers but unable to understand names    Prior Function            PT Goals (current goals can now be found in the care plan section) Acute Rehab PT Goals Patient Stated Goal: pt agreeable to therapy PT Goal Formulation: With patient Time For Goal Achievement: 09/27/22 Potential to Achieve Goals: Good Progress towards PT goals: Progressing toward goals    Frequency    Min 1X/week      PT Plan Current plan remains appropriate    Co-evaluation PT/OT/SLP Co-Evaluation/Treatment: Yes Reason for Co-Treatment: For patient/therapist safety;To address functional/ADL transfers PT goals addressed during session: Mobility/safety with mobility;Balance;Strengthening/ROM        AM-PAC PT "6 Clicks" Mobility   Outcome Measure  Help needed turning from your back to your side while in a flat bed without using bedrails?: A Little Help needed moving from lying on your back to sitting on the side of a flat bed without using bedrails?: A Little Help needed moving to and from a bed to a chair (including a wheelchair)?: A Little Help needed standing up from a chair using your arms (e.g., wheelchair or bedside chair)?: A Little Help needed to walk in hospital room?: A Little Help needed climbing 3-5 steps with a railing? : A Lot 6 Click Score: 17    End of Session Equipment Utilized During Treatment: Gait belt Activity Tolerance: Patient tolerated treatment well Patient left: in bed;with call bell/phone within reach;with bed alarm set Nurse Communication: Mobility status PT Visit Diagnosis: Other abnormalities of gait and mobility (R26.89);Muscle weakness (generalized) (M62.81);Other symptoms and signs involving the nervous  system (Z61.096)     Time: 0454-0981 PT Time Calculation (min) (ACUTE ONLY): 24 min  Charges:  $Gait Training: 8-22 mins                     Tori Earlyne Feeser PT, DPT 09/16/22, 9:52 AM

## 2022-09-16 NOTE — ED Provider Notes (Signed)
  Physical Exam  BP 114/80 (BP Location: Left Arm)   Pulse (!) 58   Temp 98.1 F (36.7 C) (Oral)   Resp 16   Ht 5\' 2"  (1.575 m)   Wt 52.6 kg   LMP  (LMP Unknown)   SpO2 99%   BMI 21.21 kg/m   Physical Exam  Procedures  Procedures  ED Course / MDM     Received care of patient from previous providers.  Please see their note for prior history, physical and care.  Briefly, this is a 47 year old female with a history of Huntington's disease, rheumatoid arthritis, cataracts, who presented to the emergency department after an unwitnessed fall at home.  Reviewed labs which did not show any clinically significant abnormalities, x-rays of thoracic, lumbar spine without acute findings, and CT head and cervical spine without acute findings.  There is concern for family's inability to take care of patient in the setting of her progressive disease related to Huntington's.  Plan for placement at [facility]     Alvira Monday, MD 09/16/22 [phone]

## 2022-09-16 NOTE — Progress Notes (Signed)
Occupational Therapy Evaluation Patient Details Name: Robyn Valenzuela MRN: [id number] DOB: 11-01-1975 Today's Date: 09/16/2022   History of Present Illness Pt is a 47 yo female with history of Huntington's disease, rheumatoid arthritis, cataracts, presenting to the ED after an unwitnessed fall at home.   Clinical Impression   The pt presented with good effort and participation in the session. She denied having pain, and she was able to provide information regarding her prior level of functioning without difficulty. She was noted to have significantly garbled speech, though her comprehension of basic questions and prompts appeared good. Due to her history of Huntington's disease, she presented with choreiform like movements; specific characteristics noted as writhing, impaired balance, involuntary movements, and impaired coordination. She required min guard to min assist for tasks, such as grooming standing at the sink, sit to stand, lower body dressing, and ambulating. She required consistent hands on assist for dynamic standing tasks. She will benefit from further OT services to maximize her safety and independence with self-care tasks & to decrease the risk for restricted participation in meaningful activities.      Recommendations for follow up therapy are one component of a multi-disciplinary discharge planning process, led by the attending physician.  Recommendations may be updated based on patient status, additional functional criteria and insurance authorization.   Assistance Recommended at Discharge Frequent or constant Supervision/Assistance  Patient can return home with the following Assistance with cooking/housework;Assist for transportation;Help with stairs or ramp for entrance;A little help with bathing/dressing/bathroom;A little help with walking and/or transfers    Functional Status Assessment  Patient has had a recent decline in their functional status and demonstrates the ability  to make significant improvements in function in a reasonable and predictable amount of time.  Equipment Recommendations  Tub/shower seat       Precautions / Restrictions Precautions Precautions: Fall Restrictions Weight Bearing Restrictions: No      Mobility   Transfers Overall transfer level: Needs assistance Equipment used: None Transfers: Sit to/from Stand Sit to Stand: Min guard                  Balance     Sitting balance-Leahy Scale: Good         Standing balance comment:  (min guard to min assist)           ADL either performed or assessed with clinical judgement   ADL Overall ADL's : Needs assistance/impaired Eating/Feeding: Set up;Sitting   Grooming: Min guard;Minimal assistance;Standing;Brushing hair;Wash/dry face;Oral care Grooming Details (indicate cue type and reason): Pt performed face washing, teeth brushing, and hair brushing in standing at the sink. She required assist for steadying/balance. She also required occasional assist for fine motor components of tasks, given impaired coordination (e.g., assist to open packets & for precision & thoroughness with hair combing).         Upper Body Dressing : Set up;Supervision/safety;Sitting Upper Body Dressing Details (indicate cue type and reason): simulated seated EOB Lower Body Dressing: Minimal assistance;Sit to/from stand   Toilet Transfer: Minimal Holiday representative;Ambulation;Grab bars                    Pertinent Vitals/Pain Pain Assessment Pain Assessment: No/denies pain     Hand Dominance Right   Extremity/Trunk Assessment     Lower Extremity Assessment Lower Extremity Assessment:  (AROM WFL. Slight generalized weakness based on informal observation.)       Communication Communication Communication: garbled speech; her comprehension appeared good  Cognition Arousal/Alertness: Awake/alert Behavior During Therapy: Flat affect Overall Cognitive Status: No  family/caregiver present to determine baseline cognitive functioning        General Comments: She appeared grossly oriented, she was able to follow 1 step commands without difficulty, she was alert and cooperative. She reported having a 48 yr old daugher who is in college, as well as having one brother                Home Living Family/patient expects to be discharged to:: Skilled nursing facility            Additional Comments: Pt reports lives in single level home, 3 steps to enter, shower      Prior Functioning/Environment Prior Level of Function : Needs Assist             Mobility Comments: pt reports ind with ambulation, no AD, reports 1 fall in last 6 months ADLs Comments: pt reports ind with bathing, dressing, standing to shower; reports someone else cooks and cleans        OT Problem List: Decreased strength;Impaired balance (sitting and/or standing);Decreased coordination;Decreased knowledge of use of DME or AE      OT Treatment/Interventions: Self-care/ADL training;Therapeutic exercise;Neuromuscular education;DME and/or AE instruction;Therapeutic activities;Balance training;Patient/family education;Visual/perceptual remediation/compensation    OT Goals(Current goals can be found in the care plan section) Acute Rehab OT Goals OT Goal Formulation: With patient Time For Goal Achievement: 09/30/22 Potential to Achieve Goals: Good ADL Goals Pt Will Perform Grooming: with supervision;standing Pt Will Perform Upper Body Dressing: with set-up;sitting Pt Will Perform Lower Body Dressing: with supervision;sit to/from stand Pt Will Transfer to Toilet: with supervision;ambulating Pt Will Perform Toileting - Clothing Manipulation and hygiene: with supervision;sit to/from stand  OT Frequency: Min 1X/week    Co-evaluation   Reason for Co-Treatment: For patient/therapist safety;To address functional/ADL transfers PT goals addressed during session: Mobility/safety with  mobility;Balance;Strengthening/ROM        AM-PAC OT "6 Clicks" Daily Activity     Outcome Measure Help from another person eating meals?: A Little Help from another person taking care of personal grooming?: A Little Help from another person toileting, which includes using toliet, bedpan, or urinal?: A Little Help from another person bathing (including washing, rinsing, drying)?: A Lot Help from another person to put on and taking off regular upper body clothing?: A Little Help from another person to put on and taking off regular lower body clothing?: A Little 6 Click Score: 17   End of Session Equipment Utilized During Treatment: Gait belt Nurse Communication: Mobility status  Activity Tolerance: Patient tolerated treatment well Patient left: in bed;with call bell/phone within reach;with nursing/sitter in room  OT Visit Diagnosis: Unsteadiness on feet (R26.81);Other abnormalities of gait and mobility (R26.89);History of falling (Z91.81)                Time: 1610-9604 OT Time Calculation (min): 15 min Charges:  OT General Charges $OT Visit: 1 Visit OT Evaluation $OT Eval Moderate Complexity: 1 Mod    Jodilyn Giese L Warner [place], OTR/L 09/16/2022, 10:16 AM

## 2022-09-16 NOTE — ED Notes (Signed)
Patient off unit to facility per provider. Patient alert and confused.  No s/s of distress at this time. Patient discharge information and belongings given to [facility] staff for transport. Patient escorted and transported by [facility].

## 2022-09-16 NOTE — Progress Notes (Signed)
Pt will discharge to [facility] today in [place]. This CSW will provide room assignment and report to RN. RN and EDP notified via secure chat. Intake paperwork was completed by pt's legal guardian for [facility] on 6/19.[facility]AR to transport. Legal guardian and SNF aware to contact Medicaid to convert pt to LTC Medicaid. No further TOC needs.

## 2023-10-10 IMAGING — MG MM DIGITAL SCREENING BILAT W/ TOMO AND CAD
8 series · 9 of 24 positions shown · non-contrast
Comparison: None available.

CLINICAL DATA: Screening.

EXAM:
DIGITAL SCREENING BILATERAL MAMMOGRAM WITH TOMOSYNTHESIS AND CAD
TECHNIQUE: Bilateral screening digital craniocaudal and mediolateral oblique
mammograms were obtained. Bilateral screening digital breast
tomosynthesis was performed. The images were evaluated with
computer-aided detection.

[L CC synth-2D]
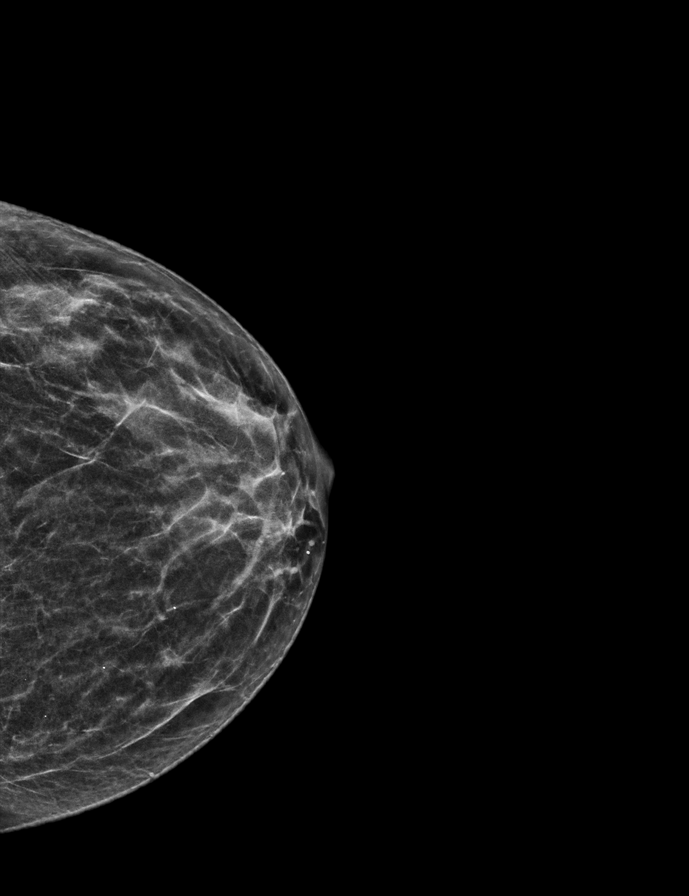

[R CC synth-2D]
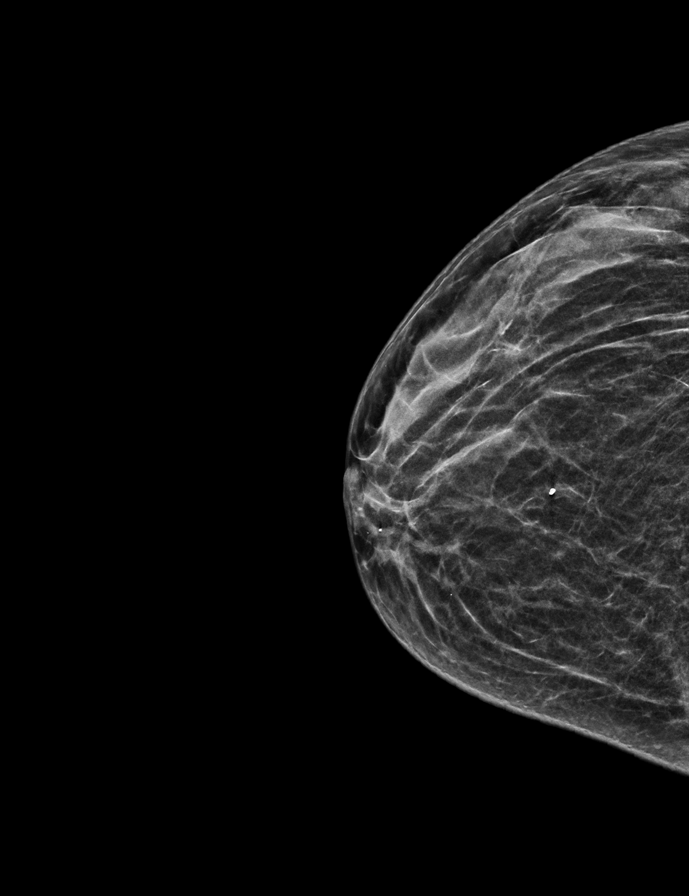

[L MLO synth-2D]
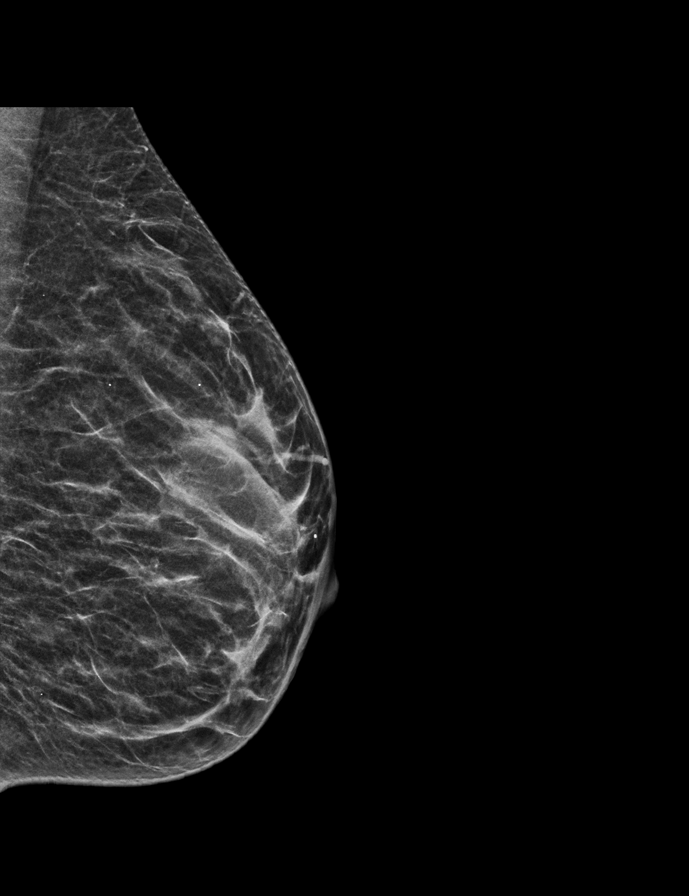

[R MLO synth-2D]
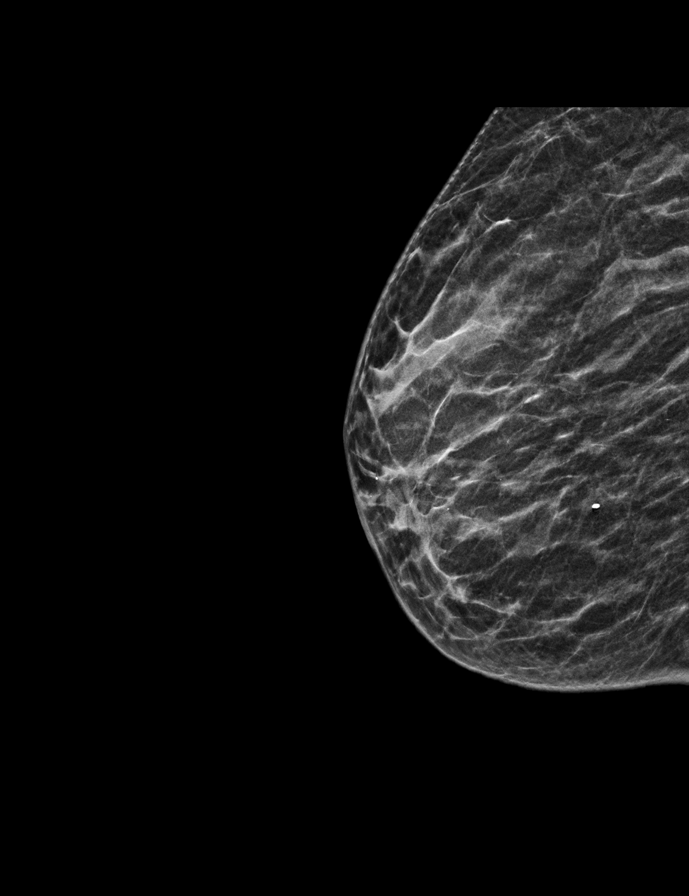

[L MLO tomo · 2 of 40 frames shown]
[frame 13/40]
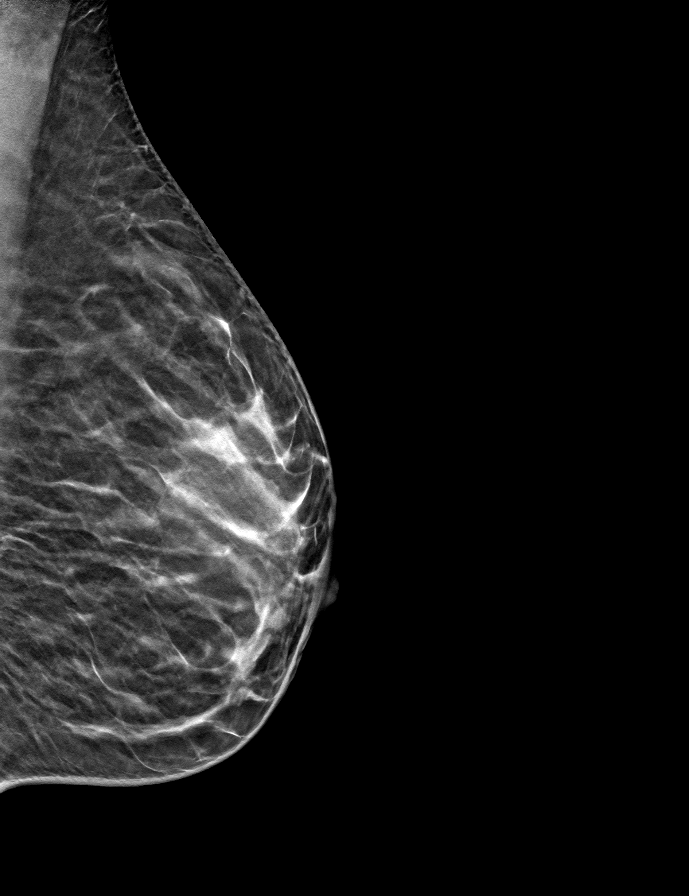
[frame 21/40]
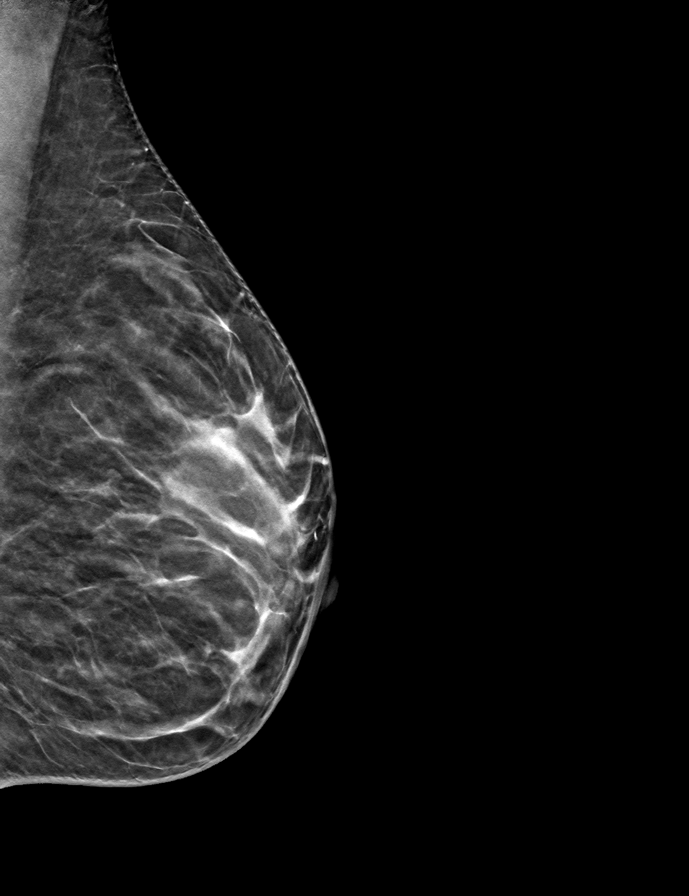

[R MLO tomo · tomo slice 18/35.0]
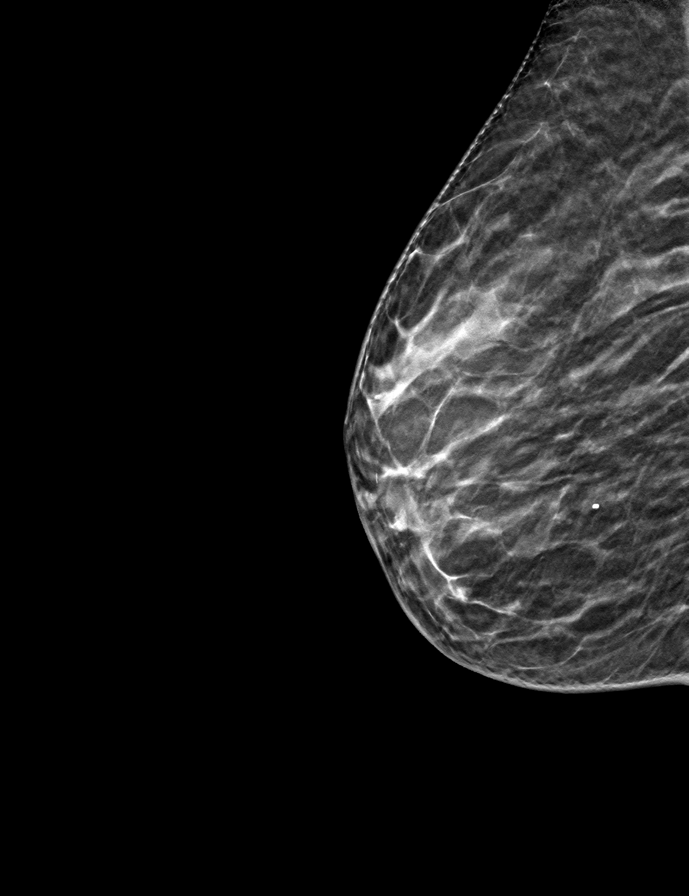

[R CC tomo · tomo slice 19/38.0]
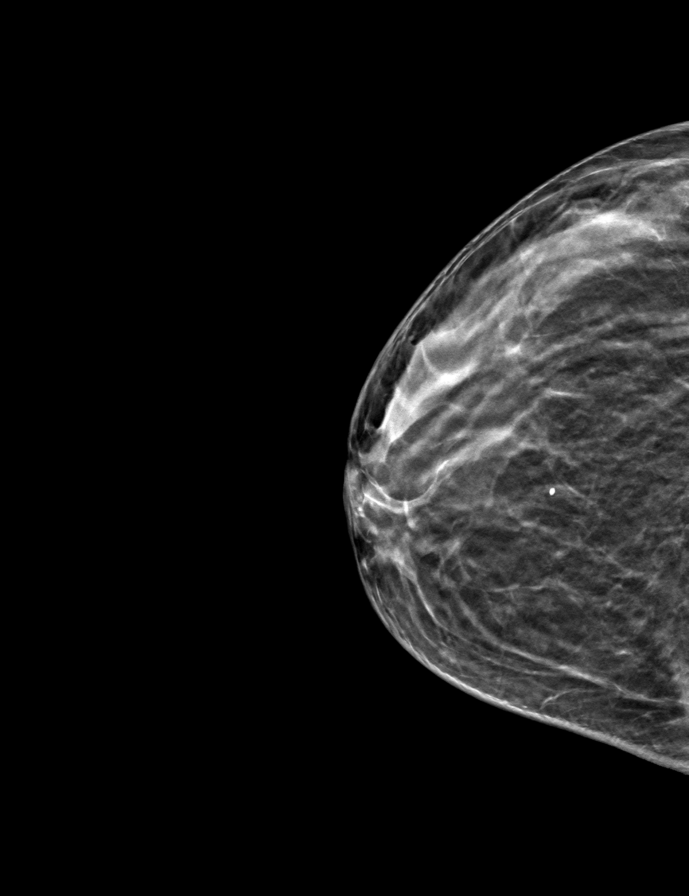

[L CC tomo · tomo slice 23/45.0]
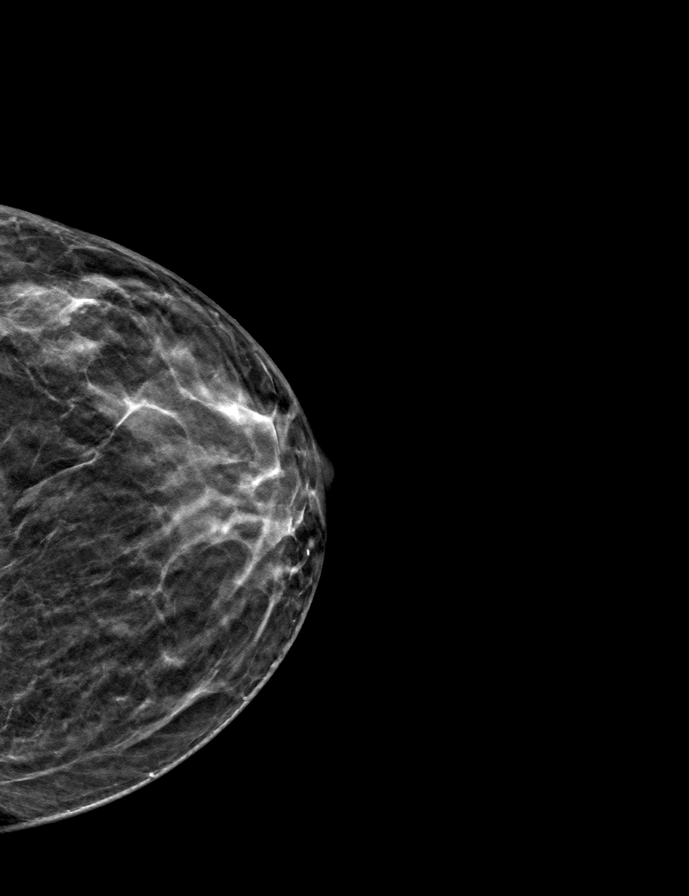

[9 of 24 positions shown; findings below may reference images not displayed]

ACR Breast Density Category b: There are scattered areas of
fibroglandular density.
FINDINGS: There are no findings suspicious for malignancy.
IMPRESSION: No mammographic evidence of malignancy. A result letter of this
screening mammogram will be mailed directly to the patient.

RECOMMENDATION:
Screening mammogram in one year. (Code:GB-Z-FIU)

BI-RADS CATEGORY  1: Negative.
